# Patient Record
Sex: Male | Born: 1960
Health system: Southern US, Community
[De-identification: ages and names within clinical notes are randomized; demographics above are authoritative.]

## PROBLEM LIST (undated history)

## (undated) DIAGNOSIS — E785 Hyperlipidemia, unspecified: Secondary | ICD-10-CM

## (undated) DIAGNOSIS — T7840XA Allergy, unspecified, initial encounter: Secondary | ICD-10-CM

## (undated) DIAGNOSIS — J45909 Unspecified asthma, uncomplicated: Secondary | ICD-10-CM

## (undated) DIAGNOSIS — L409 Psoriasis, unspecified: Secondary | ICD-10-CM

## (undated) DIAGNOSIS — G40909 Epilepsy, unspecified, not intractable, without status epilepticus: Secondary | ICD-10-CM

## (undated) HISTORY — DX: Psoriasis, unspecified: L40.9

## (undated) HISTORY — DX: Unspecified asthma, uncomplicated: J45.909

## (undated) HISTORY — DX: Allergy, unspecified, initial encounter: T78.40XA

## (undated) HISTORY — PX: POLYPECTOMY: SHX149

## (undated) HISTORY — DX: Epilepsy, unspecified, not intractable, without status epilepticus: G40.909

## (undated) HISTORY — DX: Hyperlipidemia, unspecified: E78.5

---

## 2009-10-27 HISTORY — PX: COLONOSCOPY: SHX174

## 2013-11-04 ENCOUNTER — Ambulatory Visit
Admission: RE | Admit: 2013-11-04 | Discharge: 2013-11-04 | Disposition: A | Discharge status: Home / Self Care | Payer: BC Managed Care – PPO | Source: Ambulatory Visit | Attending: Chiropractic Medicine | Admitting: Chiropractic Medicine

## 2013-11-04 ENCOUNTER — Other Ambulatory Visit: Payer: Self-pay | Admitting: Chiropractic Medicine

## 2013-11-04 DIAGNOSIS — M5412 Radiculopathy, cervical region: Secondary | ICD-10-CM

## 2013-11-04 DIAGNOSIS — M542 Cervicalgia: Secondary | ICD-10-CM

## 2013-11-04 DIAGNOSIS — M5416 Radiculopathy, lumbar region: Secondary | ICD-10-CM

## 2013-11-16 ENCOUNTER — Ambulatory Visit (INDEPENDENT_AMBULATORY_CARE_PROVIDER_SITE_OTHER): Payer: BC Managed Care – PPO | Admitting: Physician Assistant

## 2013-11-16 ENCOUNTER — Encounter: Payer: Self-pay | Admitting: Physician Assistant

## 2013-11-16 VITALS — BP 126/88 | HR 78 | Temp 98.6°F | Resp 16 | Ht 68.0 in | Wt 190.0 lb

## 2013-11-16 DIAGNOSIS — E785 Hyperlipidemia, unspecified: Secondary | ICD-10-CM

## 2013-11-16 DIAGNOSIS — L409 Psoriasis, unspecified: Secondary | ICD-10-CM | POA: Insufficient documentation

## 2013-11-16 DIAGNOSIS — G40909 Epilepsy, unspecified, not intractable, without status epilepticus: Secondary | ICD-10-CM

## 2013-11-16 DIAGNOSIS — L408 Other psoriasis: Secondary | ICD-10-CM

## 2013-11-16 DIAGNOSIS — Z7189 Other specified counseling: Secondary | ICD-10-CM

## 2013-11-16 DIAGNOSIS — Z7689 Persons encountering health services in other specified circumstances: Secondary | ICD-10-CM

## 2013-11-16 MED ORDER — PHENYTOIN SODIUM EXTENDED 100 MG PO CAPS: ORAL_CAPSULE | ORAL | Status: DC

## 2013-11-16 NOTE — Progress Notes (Signed)
Patient presents to clinic today to establish care.  Chronic Issues: Psoriasis -- Patient endorses mild symptoms.  Symptoms are well-controlled with Taclonex.  Seizure Disorder -- Patient endorses hx of grand-mal seizures beginning when he was in high school.  Has been on phenytoin 500 mg QHS for ~ 5 years.  Patient denies seizure in ~ 1 year.  Has no current neurology follow-up.  Needs a refill of his medication.  Health Maintenance: Dental -- UTD Vision -- UTD Immunizations -- Tetanus overdue. Patient declines AMA. Declines flu shot. Colonoscopy -- 2010; no abnormal findings  Past Medical History  Diagnosis Date  . Seizure disorder     History reviewed. No pertinent past surgical history.  No current outpatient prescriptions on file prior to visit.   No current facility-administered medications on file prior to visit.    Allergies  Allergen Reactions  . Tricor [Fenofibrate] Rash    Family History  Problem Relation Age of Onset  . Alcohol abuse Father   . Cancer Maternal Grandfather     History   Social History  . Marital Status: Single    Spouse Name: N/A    Number of Children: N/A  . Years of Education: N/A   Occupational History  . Not on file.   Social History Main Topics  . Smoking status: Light Tobacco Smoker    Types: Cigars  . Smokeless tobacco: Never Used     Comment: occasional cigar  . Alcohol Use: Yes     Comment: occasional  . Drug Use: No  . Sexual Activity: Yes    Birth Control/ Protection: Condom   Other Topics Concern  . Not on file   Social History Narrative  . No narrative on file    Review of Systems  Constitutional: Negative for fever and weight loss.  HENT: Negative for ear discharge, ear pain, hearing loss and tinnitus.   Eyes: Negative for blurred vision, double vision, photophobia and pain.  Respiratory: Negative for cough, shortness of breath and wheezing.   Cardiovascular: Negative for chest pain and palpitations.   Gastrointestinal: Negative for heartburn, nausea, vomiting, abdominal pain, diarrhea, constipation, blood in stool and melena.  Genitourinary: Negative for dysuria, urgency, frequency, hematuria and flank pain.       Nocturia x 1  Neurological: Positive for seizures. Negative for dizziness, loss of consciousness and headaches.  Endo/Heme/Allergies: Negative for environmental allergies.  Psychiatric/Behavioral: Negative for depression, suicidal ideas, hallucinations and substance abuse. The patient is not nervous/anxious and does not have insomnia.     BP 126/88  Pulse 78  Temp(Src) 98.6 F (37 C) (Oral)  Resp 16  Ht 5\' 8"  (1.727 m)  Wt 190 lb (86.183 kg)  BMI 28.90 kg/m2  Physical Exam  Vitals reviewed. Constitutional: He is oriented to person, place, and time and well-developed, well-nourished, and in no distress.  HENT:  Head: Normocephalic and atraumatic.  Right Ear: External ear normal.  Left Ear: External ear normal.  Nose: Nose normal.  Mouth/Throat: Oropharynx is clear and moist. No oropharyngeal exudate.  TM within normal limits.  Eyes: Conjunctivae and EOM are normal. Pupils are equal, round, and reactive to light.  Neck: Neck supple.  Cardiovascular: Normal rate, regular rhythm, normal heart sounds and intact distal pulses.   Pulmonary/Chest: Effort normal and breath sounds normal. No respiratory distress. He has no wheezes. He has no rales. He exhibits no tenderness.  Abdominal: Soft. Bowel sounds are normal. He exhibits no distension and no mass. There is no tenderness.  There is no rebound and no guarding.  Lymphadenopathy:    He has no cervical adenopathy.  Neurological: He is alert and oriented to person, place, and time.  Skin: Skin is warm and dry.  Presence of scattered erythematous psoriatic papules with silvery scales.  Psychiatric: Affect normal.    No results found for this or any previous visit (from the past 2160 hour(s)).  Assessment/Plan: No  problem-specific assessment & plan notes found for this encounter.

## 2013-11-16 NOTE — Progress Notes (Signed)
Pre visit review using our clinic review tool, if applicable. No additional management support is needed unless otherwise documented below in the visit note. 

## 2013-11-16 NOTE — Patient Instructions (Signed)
Please return to our office at you earliest convenience for labs.  I will call you with your results.  Please get your records from your previous doctor sent to our office.  Please return in 3 months for follow-up.  For arm pain, please take Aleve and apply a topical Icy Hot to the area.  Also apply a heating pad.  Please call or return to clinic if symptoms do not continue to improve.

## 2013-11-20 DIAGNOSIS — Z7689 Persons encountering health services in other specified circumstances: Secondary | ICD-10-CM | POA: Insufficient documentation

## 2013-11-20 NOTE — Assessment & Plan Note (Signed)
Patient endorses history of hyperlipidemia.  Denies current medication.  Will need fasting lipid profile.

## 2013-11-20 NOTE — Assessment & Plan Note (Signed)
Medical history reviewed and updated.  Declines recommended immunizations.  Will obtain records from previous PCP.

## 2013-11-20 NOTE — Assessment & Plan Note (Signed)
Continue current medication regimen

## 2013-11-20 NOTE — Assessment & Plan Note (Signed)
Controlled on phenytoin.  Will need referral to Neurology for routine follow-up.  Will refill phenytoin.  Will obtain CBC, CMP, folate and phenytoin levels.

## 2013-11-29 ENCOUNTER — Telehealth: Payer: Self-pay | Admitting: Physician Assistant

## 2013-11-29 NOTE — Telephone Encounter (Signed)
Relevant patient education assigned to patient using Emmi. ° °

## 2013-12-16 ENCOUNTER — Telehealth: Payer: Self-pay | Admitting: *Deleted

## 2013-12-16 ENCOUNTER — Encounter: Payer: Self-pay | Admitting: Neurology

## 2013-12-16 ENCOUNTER — Other Ambulatory Visit: Payer: Self-pay | Admitting: *Deleted

## 2013-12-16 ENCOUNTER — Ambulatory Visit (INDEPENDENT_AMBULATORY_CARE_PROVIDER_SITE_OTHER): Payer: BC Managed Care – PPO | Admitting: Neurology

## 2013-12-16 VITALS — BP 140/80 | HR 68 | Resp 14 | Ht 68.0 in | Wt 189.2 lb

## 2013-12-16 DIAGNOSIS — T4275XA Adverse effect of unspecified antiepileptic and sedative-hypnotic drugs, initial encounter: Secondary | ICD-10-CM

## 2013-12-16 DIAGNOSIS — M839 Adult osteomalacia, unspecified: Secondary | ICD-10-CM

## 2013-12-16 DIAGNOSIS — G40909 Epilepsy, unspecified, not intractable, without status epilepticus: Secondary | ICD-10-CM

## 2013-12-16 DIAGNOSIS — R569 Unspecified convulsions: Secondary | ICD-10-CM

## 2013-12-16 DIAGNOSIS — M835 Other drug-induced osteomalacia in adults: Secondary | ICD-10-CM

## 2013-12-16 MED ORDER — PHENYTOIN SODIUM EXTENDED 100 MG PO CAPS: ORAL_CAPSULE | ORAL | Status: DC

## 2013-12-16 NOTE — Progress Notes (Signed)
NEUROLOGY CONSULTATION NOTE  Jacob Morales MRN: [id number] DOB: June 11, 1961  Referring provider: Leeanne Rio Primary care provider: Leeanne Rio  Reason for consult:  Establish care for seizures  Thank you for your kind referral of [name] for consultation of the above symptoms. Although his history is well known to you, please allow me to reiterate it for the purpose of our medical record.   HISTORY OF PRESENT ILLNESS: This is a very pleasant 53 year old right-handed man with a history of seizures since age 70. He reports hitting the back of his head at age 41, there was no loss of consciousness at that time, then a few months later he had his first seizure described as twitching of the arms. This did not progress to a generalized tonic-clonic seizure. A few months later, he had 2 more episodes of body twitching, one time he fell due to leg twitching. He was diagnosed with seizures at that time and started on Dilantin. His first generalized tonic-clonic seizure occurred at age 69, in the setting of stopping his medication. His mother had described a blank stare and then a generalized convulsion. He has had 3 GTCs in his lifetime, provoked by sleep deprivation and missing medications. The last GTC was at age 86. He reports that if he would miss Dilantin for a week, he would feel the twitching coming on. The last time this happened was several years ago. He denies any staring or unresponsive episodes, gaps in time, olfactory or gustatory hallucinations, focal numbness, tingling, or weakness. He has been taking Dilantin 500 mg at bedtime for many years. He does not recall his last Dilantin level. Last EEG and MRI were more than 10 years ago in [place], he vaguely recalls her the EEG as being abnormal.  He denies any headaches, dizziness, diplopia, dysarthria, dysphasia, neck or back pain, bowel or bladder dysfunction. He denies any falls, fractures, gum problems. He  currently works as an Insurance underwriter.  Epilepsy Risk Factors:  He had a normal birth and early development.  There is no history of febrile convulsions, CNS infections such as meningitis/encephalitis, significant traumatic brain injury, neurosurgical procedures, or family history of seizures.  Prior AEDs: None except Dilantin   PAST MEDICAL HISTORY: Past Medical History  Diagnosis Date  . Seizure disorder   . Psoriasis     PAST SURGICAL HISTORY: No past surgical history on file.  MEDICATIONS: Current Outpatient Prescriptions on File Prior to Visit  Medication Sig Dispense Refill  . calcipotriene-betamethasone (TACLONEX) ointment Apply 1 application topically daily.       No current facility-administered medications on file prior to visit.    ALLERGIES: Allergies  Allergen Reactions  . Tricor [Fenofibrate] Rash    FAMILY HISTORY: Family History  Problem Relation Age of Onset  . Alcohol abuse Father   . Cancer Maternal Grandfather     SOCIAL HISTORY: History   Social History  . Marital Status: Single    Spouse Name: N/A    Number of Children: N/A  . Years of Education: N/A   Occupational History  . Not on file.   Social History Main Topics  . Smoking status: Never Smoker   . Smokeless tobacco: Never Used  . Alcohol Use: Yes     Comment: occasional  . Drug Use: No  . Sexual Activity: Yes    Birth Control/ Protection: Condom   Other Topics Concern  . Not on file   Social History Narrative  .  No narrative on file    REVIEW OF SYSTEMS: Constitutional: No fevers, chills, or sweats, no generalized fatigue, change in appetite Eyes: No visual changes, double vision, eye pain Ear, nose and throat: No hearing loss, ear pain, nasal congestion, sore throat Cardiovascular: No chest pain, palpitations Respiratory:  No shortness of breath at rest or with exertion, wheezes GastrointestinaI: No nausea, vomiting, diarrhea, abdominal pain, fecal  incontinence Genitourinary:  No dysuria, urinary retention or frequency Musculoskeletal:  No neck pain, back pain Integumentary: No rash, pruritus, skin lesions Neurological: as above Psychiatric: No depression, insomnia, anxiety Endocrine: No palpitations, fatigue, diaphoresis, mood swings, change in appetite, change in weight, increased thirst Hematologic/Lymphatic:  No anemia, purpura, petechiae. Allergic/Immunologic: no itchy/runny eyes, nasal congestion, recent allergic reactions, rashes  PHYSICAL EXAM: Filed Vitals:   12/16/13 1519  BP: 140/80  Pulse: 68  Resp: 14   General: No acute distress Head:  Normocephalic/atraumatic Neck: supple, no paraspinal tenderness, full range of motion Back: No paraspinal tenderness Heart: regular rate and rhythm Lungs: Clear to auscultation bilaterally. Vascular: No carotid bruits. Skin/Extremities: No rash, no edema Neurological Exam: Mental status: alert and oriented to person, place, and time, no dysarthria or dysphagia. Fund of knowledge is appropriate.  Recent and remote memory intact.  Attention span and concentration normal.  Repeats and names without difficulty. Cranial nerves: CN I: not tested CN II: pupils equal, round and reactive to light, visual fields intact, fundi unremarkable. CN III, IV, VI:  full range of motion, no nystagmus, no ptosis CN V: facial sensation intact CN VII: upper and lower face symmetric CN VIII: hearing intact CN IX, X: gag intact, uvula midline CN XI: sternocleidomastoid and trapezius muscles intact CN XII: tongue midline Bulk & Tone: normal, no fasciculations. Motor: 5/5 throughout with no pronator drift. Sensation: intact to light touch, cold, pin, vibration and joint position sense.  No extinction to double simultaneous stimulation.  Romberg test negative Deep Tendon Reflexes: +2 throughout, no clonus Plantar responses: downgoing bilaterally Finger to nose testing: No incoordination Gait:  narrow-based and steady, able to tandem walk adequately.  IMPRESSION: This is a very pleasant 53 year old right-handed man with a history of seizures since age 60. By description, seizures are suggestive of a primary generalized epilepsy with myoclonic jerks and generalized tonic-clonic seizures. He denies any history of absence seizures.  He has been well controlled on Dilantin 500 mg at bedtime for several years. Last GTC was almost 20 years ago. Dilantin is indicated for partial epilepsy, and he may benefit from more broad-spectrum antiepileptic medications if he were having side effects of the Dilantin, however since seizures have been well controlled on the medication and he is not having any side effects, I am very hesitant to switch him at this time. Baseline Dilantin level will be checked. We discussed the long-term side effects of chronic Dilantin therapy, including bone loss and neuropathy. Exam today is normal. A bone density scan and vitamin D 25-hydroxy level will be checked. Avoidance of seizure triggers, including missing medications, alcohol, and sleep deprivation were discussed.  [place] driving laws were discussed with the patient, and he knows to stop driving after a seizure, until 6 months seizure-free.   Return to clinic in 6 months or earlier if needed.  Thank you for allowing me to participate in the care of this patient. Please do not hesitate to call for any questions or concerns.   Ellouise Newer, M.D.

## 2013-12-16 NOTE — Patient Instructions (Signed)
1. Continue Dilantin 500mg  at bedtime 2. Check Dilantin level, vitamin D level 3. Check bone density scan  Seizure precautions  1. If medication has been prescribed for you to prevent seizures, take it exactly as directed.  Do not stop taking the medicine without talking to your doctor first, even if you have not had a seizure in a long time.   2. Avoid activities in which a seizure would cause danger to yourself or to others.  Don't operate dangerous machinery, swim alone, or climb in high or dangerous places, such as on ladders, roofs, or girders.  Do not drive unless your doctor says you may.  3. If you have any warning that you may have a seizure, lay down in a safe place where you can't hurt yourself.    4.  No driving for 6 months from last seizure, as per [facility].   Please refer to the following link on the [facility] website for more information: http://[email or web address]/[email or web address]/[email or web address]   5.  Maintain good sleep hygiene.  6.  Notify your neurology if you are planning pregnancy or if you become pregnant.  7.  Contact your doctor if you have any problems that may be related to the medicine you are taking.  8.  Call 911 and bring the patient back to the ED if:        A.  The seizure lasts longer than 5 minutes.       B.  The patient doesn't awaken shortly after the seizure  C.  The patient has new problems such as difficulty seeing, speaking or moving  D.  The patient was injured during the seizure  E.  The patient has a temperature over 102 F (39C)  F.  The patient vomited and now is having trouble breathing

## 2013-12-16 NOTE — Telephone Encounter (Signed)
Patient has appt at [facility] 01/03/14 at 2:45  For dexa scan

## 2013-12-20 LAB — VITAMIN D 25 HYDROXY (VIT D DEFICIENCY, FRACTURES): VIT D 25 HYDROXY: 17 ng/mL — AB (ref 30–89)

## 2013-12-20 LAB — PHENYTOIN LEVEL, TOTAL: PHENYTOIN LVL: 9.6 ug/mL — AB (ref 10.0–20.0)

## 2013-12-26 ENCOUNTER — Other Ambulatory Visit: Payer: Self-pay | Admitting: *Deleted

## 2013-12-26 ENCOUNTER — Telehealth: Payer: Self-pay | Admitting: Neurology

## 2013-12-26 MED ORDER — VITAMIN D (ERGOCALCIFEROL) 1.25 MG (50000 UNIT) PO CAPS: 50000.0000 [IU] | ORAL_CAPSULE | ORAL | Status: DC

## 2013-12-26 NOTE — Telephone Encounter (Signed)
Vit d was ordered and sent to the pharmacy message was left on machine to take one tablet by mouth for 8 weeks then patient can take an over the counted vit d after that

## 2013-12-26 NOTE — Telephone Encounter (Signed)
Rt returning your call

## 2013-12-26 NOTE — Telephone Encounter (Signed)
Message copied by Ella Jubilee on Mon Dec 26, 2013  2:02 PM ------      Message from: Cameron Sprang      Created: Mon Dec 26, 2013  9:41 AM      Regarding: vitamin D replacement       Hi Danae Chen,            For Mr. Hackbart, his vitamin D level was low, did we send a prescription for replacement?  I can't recall signing one.  Thanks ------

## 2013-12-27 NOTE — Telephone Encounter (Signed)
Unable to reach patient left message on machine that Rx for Vit d was sent to the pharmacy on file advised to to call back if he had any questions

## 2014-01-03 ENCOUNTER — Other Ambulatory Visit: Payer: BC Managed Care – PPO

## 2014-04-20 ENCOUNTER — Telehealth: Payer: Self-pay | Admitting: Neurology

## 2014-04-20 NOTE — Telephone Encounter (Signed)
Please call regarding med refills. CB [phone] / Venida Jarvis

## 2014-04-21 ENCOUNTER — Other Ambulatory Visit: Payer: Self-pay | Admitting: *Deleted

## 2014-04-21 DIAGNOSIS — G40909 Epilepsy, unspecified, not intractable, without status epilepticus: Secondary | ICD-10-CM

## 2014-04-21 MED ORDER — PHENYTOIN SODIUM EXTENDED 100 MG PO CAPS: ORAL_CAPSULE | ORAL | Status: DC

## 2014-04-21 NOTE — Telephone Encounter (Signed)
Rx refilled.

## 2014-04-21 NOTE — Telephone Encounter (Signed)
Patient requesting refill. 

## 2014-06-16 ENCOUNTER — Ambulatory Visit: Payer: BC Managed Care – PPO | Admitting: Neurology

## 2014-06-29 ENCOUNTER — Encounter: Payer: Self-pay | Admitting: Neurology

## 2014-06-29 ENCOUNTER — Ambulatory Visit (INDEPENDENT_AMBULATORY_CARE_PROVIDER_SITE_OTHER): Payer: Managed Care, Other (non HMO) | Admitting: Neurology

## 2014-06-29 VITALS — BP 110/78 | HR 62 | Resp 16 | Ht 68.0 in | Wt 189.0 lb

## 2014-06-29 DIAGNOSIS — E559 Vitamin D deficiency, unspecified: Secondary | ICD-10-CM

## 2014-06-29 DIAGNOSIS — R569 Unspecified convulsions: Secondary | ICD-10-CM

## 2014-06-29 NOTE — Progress Notes (Signed)
NEUROLOGY FOLLOW UP OFFICE NOTE  Jacob Morales [id number]  HISTORY OF PRESENT ILLNESS: I had the pleasure of seeing Jacob Morales in follow-up in the neurology clinic on 06/29/2014.  The patient was last seen 7 months ago for seizures.  He is well-controlled on Dilantin 500mg  qhs with no seizures since age 53.  Bloodwork from February 2015 showed a Dilantin level of 9.6 and vitamin D level of 17. He took 50000 units of ergocalciferol for several weeks. He denies any seizures or seizure-like symptoms, no myoclonic jerks. Denies any headaches, dizziness, diplopia, focal numbness/tingling/weakness.  He reports fatigue today due to change in shift, he works from 2:30pm to midnight, sometimes up to 2am, but still gest 7-8 hours of sleep after. He reports compliance to medications. He is driving.  Seizure History: This is a very pleasant 53 yo RH man with a history of seizures since age 31. He reports hitting the back of his head at age 30, there was no loss of consciousness at that time, then a few months later he had his first seizure described as twitching of the arms. This did not progress to a generalized tonic-clonic seizure. A few months later, he had 2 more episodes of body twitching, one time he fell due to leg twitching. He was diagnosed with seizures at that time and started on Dilantin. His first generalized tonic-clonic seizure occurred at age 53, in the setting of stopping his medication. His mother had described a blank stare and then a generalized convulsion. He has had 3 GTCs in his lifetime, provoked by sleep deprivation and missing medications. The last GTC was at age 53. He reports that if he would miss Dilantin for a week, he would feel the twitching coming on. The last time this happened was several years ago. He denies any staring or unresponsive episodes, gaps in time, olfactory or gustatory hallucinations, focal numbness, tingling, or weakness. He has been taking Dilantin 500  mg at bedtime for many years.  He denies any headaches, dizziness, diplopia, dysarthria, dysphasia, neck or back pain, bowel or bladder dysfunction. He denies any falls, fractures, gum problems. He currently works as an Insurance underwriter.   Epilepsy Risk Factors: He had a normal birth and early development. There is no history of febrile convulsions, CNS infections such as meningitis/encephalitis, significant traumatic brain injury, neurosurgical procedures, or family history of seizures.   Prior AEDs: None except Dilantin  Diagnostic Data: Last EEG and MRI were more than 10 years ago in [place], he vaguely recalls her the EEG as being abnormal.   PAST MEDICAL HISTORY: Past Medical History  Diagnosis Date  . Seizure disorder   . Psoriasis     MEDICATIONS: Current Outpatient Prescriptions on File Prior to Visit  Medication Sig Dispense Refill  . calcipotriene-betamethasone (TACLONEX) ointment Apply 1 application topically daily.      . phenytoin (DILANTIN) 100 MG ER capsule Take 5 capsules at night.  150 capsule  3  . Vitamin D, Ergocalciferol, (DRISDOL) 50000 UNITS CAPS capsule Take 1 capsule (50,000 Units total) by mouth every 7 (seven) days.  8 capsule  0   No current facility-administered medications on file prior to visit.    ALLERGIES: Allergies  Allergen Reactions  . Tricor [Fenofibrate] Rash    FAMILY HISTORY: Family History  Problem Relation Age of Onset  . Alcohol abuse Father   . Cancer Maternal Grandfather     SOCIAL HISTORY: History   Social History  . Marital  Status: Single    Spouse Name: N/A    Number of Children: N/A  . Years of Education: N/A   Occupational History  . Not on file.   Social History Main Topics  . Smoking status: Never Smoker   . Smokeless tobacco: Never Used  . Alcohol Use: Yes     Comment: occasional  . Drug Use: No  . Sexual Activity: Yes    Birth Control/ Protection: Condom   Other Topics Concern  . Not on file    Social History Narrative  . No narrative on file    REVIEW OF SYSTEMS: Constitutional: No fevers, chills, or sweats, + generalized fatigue, no change in appetite Eyes: No visual changes, double vision, eye pain Ear, nose and throat: No hearing loss, ear pain, nasal congestion, sore throat Cardiovascular: No chest pain, palpitations Respiratory:  No shortness of breath at rest or with exertion, wheezes GastrointestinaI: No nausea, vomiting, diarrhea, abdominal pain, fecal incontinence Genitourinary:  No dysuria, urinary retention or frequency Musculoskeletal:  No neck pain, back pain Integumentary: No rash, pruritus, skin lesions Neurological: as above Psychiatric: No depression, insomnia, anxiety Endocrine: No palpitations, fatigue, diaphoresis, mood swings, change in appetite, change in weight, increased thirst Hematologic/Lymphatic:  No anemia, purpura, petechiae. Allergic/Immunologic: no itchy/runny eyes, nasal congestion, recent allergic reactions, rashes  PHYSICAL EXAM: Filed Vitals:   06/29/14 0833  BP: 110/78  Pulse: 62  Resp: 16   General: No acute distress Head:  Normocephalic/atraumatic Neck: supple, no paraspinal tenderness, full range of motion Heart:  Regular rate and rhythm Lungs:  Clear to auscultation bilaterally Back: No paraspinal tenderness Skin/Extremities: No rash, no edema Neurological Exam: alert and oriented to person, place, and time. No aphasia or dysarthria. Fund of knowledge is appropriate.  Recent and remote memory are intact.  Attention and concentration are normal.    Able to name objects and repeat phrases. Cranial nerves: Pupils equal, round, reactive to light.  Fundoscopic exam unremarkable, no papilledema. Extraocular movements intact with no nystagmus. Visual fields full. Facial sensation intact. No facial asymmetry. Tongue, uvula, palate midline.  Motor: Bulk and tone normal, muscle strength 5/5 throughout with no pronator drift.  Sensation  to light touch, temperature and vibration intact.  No extinction to double simultaneous stimulation.  Deep tendon reflexes 2+ throughout, toes downgoing.  Finger to nose testing intact.  Gait narrow-based and steady, able to tandem walk adequately.  Romberg negative.  IMPRESSION: This is a very pleasant 52 yo RH man with a history of seizures since age 41. By description, seizures are suggestive of a primary generalized epilepsy with myoclonic jerks and generalized tonic-clonic seizures. He denies any history of absence seizures. He has been well controlled on Dilantin 500 mg at bedtime for several years. Last GTC was almost 20 years ago. Dilantin is indicated for partial epilepsy, and he may benefit from more broad-spectrum antiepileptic medications if he were having side effects of the Dilantin, however since seizures have been well controlled on the medication and he is not having any side effects, I am very hesitant to switch him at this time. Dilantin level is 9.6, however we discussed that if he has been seizure-free on this level for more than 20 years, no changes would be made.  He has been unable to do bone density scan, his vitamin D level was low 6 months ago, s/p replacement. Recheck vitamin D, start daily calcium with vitamin D supplementation.  Refills for Dilantin sent today. We again discussed avoidance of seizure  triggers, including missing medications, alcohol, and sleep deprivation were discussed. [place] driving laws were discussed with the patient, and he knows to stop driving after a seizure, until 6 months seizure-free. He will follow-up annually and knows to call our office for any problems in the interim.  Thank you for allowing me to participate in his care.  Please do not hesitate to call for any questions or concerns.  The duration of this appointment visit was 15 minutes of face-to-face time with the patient.  Greater than 50% of this time was spent in counseling, explanation of  diagnosis, planning of further management, and coordination of care.   Ellouise Newer, M.D.

## 2014-06-29 NOTE — Patient Instructions (Addendum)
1. Continue Dilantin 500mg  at bedtime 2. Bloodwork for vitamin D level 3. Start daily calcium 600mg  + vitamin D 400 IU twice a day 4. Schedule DEXA scan 5. Follow-up in 1 year, call for any problems  Seizure Precautions: 1. If medication has been prescribed for you to prevent seizures, take it exactly as directed.  Do not stop taking the medicine without talking to your doctor first, even if you have not had a seizure in a long time.   2. Avoid activities in which a seizure would cause danger to yourself or to others.  Don't operate dangerous machinery, swim alone, or climb in high or dangerous places, such as on ladders, roofs, or girders.  Do not drive unless your doctor says you may.  3. If you have any warning that you may have a seizure, lay down in a safe place where you can't hurt yourself.    4.  No driving for 6 months from last seizure, as per [facility].   Please refer to the following link on the [place] website for more information: http://[email or web address]/[email or web address]/drivingu.cfm   5.  Maintain good sleep hygiene.  6.  Contact your doctor if you have any problems that may be related to the medicine you are taking.  7.  Call 911 and bring the patient back to the ED if:        A.  The seizure lasts longer than 5 minutes.       B.  The patient doesn't awaken shortly after the seizure  C.  The patient has new problems such as difficulty seeing, speaking or moving  D.  The patient was injured during the seizure  E.  The patient has a temperature over 102 F (39C)  F.  The patient vomited and now is having trouble breathing

## 2014-06-30 LAB — VITAMIN D 25 HYDROXY (VIT D DEFICIENCY, FRACTURES): Vit D, 25-Hydroxy: 19 ng/mL — ABNORMAL LOW (ref 30–89)

## 2014-07-04 ENCOUNTER — Ambulatory Visit
Admission: RE | Admit: 2014-07-04 | Discharge: 2014-07-04 | Disposition: A | Discharge status: Home / Self Care | Payer: Managed Care, Other (non HMO) | Source: Ambulatory Visit | Attending: Neurology | Admitting: Neurology

## 2014-07-10 ENCOUNTER — Encounter: Payer: Self-pay | Admitting: Family Medicine

## 2014-07-10 ENCOUNTER — Other Ambulatory Visit: Payer: Self-pay | Admitting: Family Medicine

## 2014-07-10 MED ORDER — VITAMIN D (ERGOCALCIFEROL) 1.25 MG (50000 UNIT) PO CAPS: 50000.0000 [IU] | ORAL_CAPSULE | ORAL | Status: DC

## 2014-07-13 ENCOUNTER — Telehealth: Payer: Self-pay | Admitting: Family Medicine

## 2014-07-13 NOTE — Telephone Encounter (Signed)
Message copied by Thurmon Fair on Thu Jul 13, 2014  2:56 PM ------      Message from: Cameron Sprang      Created: Thu Jul 13, 2014 12:29 PM      Regarding: DEXA results       Pls let him know bone scan normal, continue with vitamin D replacement. Thanks ------

## 2014-07-13 NOTE — Telephone Encounter (Signed)
All of patient's recent results were mailed with a letter to his home due to not being able to reach him by phone.

## 2014-08-24 ENCOUNTER — Other Ambulatory Visit: Payer: Self-pay | Admitting: Neurology

## 2014-09-19 ENCOUNTER — Ambulatory Visit (INDEPENDENT_AMBULATORY_CARE_PROVIDER_SITE_OTHER): Payer: Managed Care, Other (non HMO) | Admitting: Physician Assistant

## 2014-09-19 ENCOUNTER — Encounter: Payer: Self-pay | Admitting: Physician Assistant

## 2014-09-19 VITALS — BP 111/77 | HR 108 | Temp 97.9°F | Resp 17 | Ht 69.0 in | Wt 190.0 lb

## 2014-09-19 DIAGNOSIS — Z125 Encounter for screening for malignant neoplasm of prostate: Secondary | ICD-10-CM | POA: Insufficient documentation

## 2014-09-19 DIAGNOSIS — Z136 Encounter for screening for cardiovascular disorders: Secondary | ICD-10-CM

## 2014-09-19 DIAGNOSIS — L409 Psoriasis, unspecified: Secondary | ICD-10-CM

## 2014-09-19 DIAGNOSIS — R5382 Chronic fatigue, unspecified: Secondary | ICD-10-CM | POA: Insufficient documentation

## 2014-09-19 DIAGNOSIS — Z23 Encounter for immunization: Secondary | ICD-10-CM

## 2014-09-19 DIAGNOSIS — E785 Hyperlipidemia, unspecified: Secondary | ICD-10-CM

## 2014-09-19 DIAGNOSIS — E559 Vitamin D deficiency, unspecified: Secondary | ICD-10-CM

## 2014-09-19 DIAGNOSIS — Z0000 Encounter for general adult medical examination without abnormal findings: Secondary | ICD-10-CM

## 2014-09-19 DIAGNOSIS — G40909 Epilepsy, unspecified, not intractable, without status epilepticus: Secondary | ICD-10-CM

## 2014-09-19 LAB — CBC
HEMATOCRIT: 44.6 % (ref 39.0–52.0)
HEMOGLOBIN: 14.9 g/dL (ref 13.0–17.0)
MCHC: 33.3 g/dL (ref 30.0–36.0)
MCV: 89 fl (ref 78.0–100.0)
Platelets: 160 10*3/uL (ref 150.0–400.0)
RBC: 5.01 Mil/uL (ref 4.22–5.81)
RDW: 13.8 % (ref 11.5–15.5)
WBC: 3.5 10*3/uL — ABNORMAL LOW (ref 4.0–10.5)

## 2014-09-19 LAB — BASIC METABOLIC PANEL WITH GFR
BUN: 18 mg/dL (ref 6–23)
CO2: 26 mEq/L (ref 19–32)
CREATININE: 0.87 mg/dL (ref 0.50–1.35)
Calcium: 9 mg/dL (ref 8.4–10.5)
Chloride: 106 mEq/L (ref 96–112)
GLUCOSE: 92 mg/dL (ref 70–99)
POTASSIUM: 4.4 meq/L (ref 3.5–5.3)
Sodium: 141 mEq/L (ref 135–145)

## 2014-09-19 LAB — URINALYSIS, ROUTINE W REFLEX MICROSCOPIC
BILIRUBIN URINE: NEGATIVE
HGB URINE DIPSTICK: NEGATIVE
Ketones, ur: NEGATIVE
Leukocytes, UA: NEGATIVE
Nitrite: NEGATIVE
RBC / HPF: NONE SEEN (ref 0–?)
Specific Gravity, Urine: 1.025 (ref 1.000–1.030)
Total Protein, Urine: NEGATIVE
UROBILINOGEN UA: 0.2 (ref 0.0–1.0)
Urine Glucose: NEGATIVE
WBC, UA: NONE SEEN (ref 0–?)
pH: 5.5 (ref 5.0–8.0)

## 2014-09-19 LAB — HEPATIC FUNCTION PANEL
ALT: 24 U/L (ref 0–53)
AST: 21 U/L (ref 0–37)
Albumin: 4.3 g/dL (ref 3.5–5.2)
Alkaline Phosphatase: 73 U/L (ref 39–117)
BILIRUBIN DIRECT: 0 mg/dL (ref 0.0–0.3)
BILIRUBIN TOTAL: 0.5 mg/dL (ref 0.2–1.2)
Total Protein: 6.8 g/dL (ref 6.0–8.3)

## 2014-09-19 LAB — LIPID PANEL W/DIRECT LDL/HDL RATIO
Cholesterol: 217 mg/dL — ABNORMAL HIGH (ref 0–200)
HDL: 36 mg/dL — ABNORMAL LOW (ref >39)
LDL DIRECT: 135 mg/dL — AB
LDL/HDL RATIO (DIRECT LDL): 3.8 ratio
TRIGLYCERIDES: 236 mg/dL — AB (ref <150)
Total Chol/HDL Ratio: 6 Ratio

## 2014-09-19 LAB — PSA: PSA: 0.78 ng/mL (ref 0.10–4.00)

## 2014-09-19 LAB — TSH: TSH: 1.46 u[IU]/mL (ref 0.35–4.50)

## 2014-09-19 LAB — HEMOGLOBIN A1C: Hgb A1c MFr Bld: 6.1 % (ref 4.6–6.5)

## 2014-09-19 MED ORDER — CALCIPOTRIENE-BETAMETH DIPROP 0.005-0.064 % EX OINT: 1.0000 "application " | TOPICAL_OINTMENT | Freq: Every day | CUTANEOUS | Status: DC

## 2014-09-19 MED ORDER — CALCIPOTRIENE-BETAMETH DIPROP 0.005-0.064 % EX SUSP: Freq: Every day | CUTANEOUS | Status: DC

## 2014-09-19 NOTE — Assessment & Plan Note (Signed)
I have reviewed the patient's medical history in detail and updated the computerized patient record. Patient up-to-date on health maintenance.  PHQ-9 Depression screen negative. No hx of fall.  Will obtain fasting labs at today's visit.

## 2014-09-19 NOTE — Assessment & Plan Note (Signed)
Medications refilled.  Referral placed to [facility].

## 2014-09-19 NOTE — Progress Notes (Signed)
Pre visit review using our clinic review tool, if applicable. No additional management support is needed unless otherwise documented below in the visit note. 

## 2014-09-19 NOTE — Assessment & Plan Note (Signed)
Asymptomatic.  Will obtain PSA today after discussion of risks vs benefits of testing.

## 2014-09-19 NOTE — Progress Notes (Signed)
Patient presents to clinic today for annual exam.  Patient is fasting for labs.  Acute Concerns: Patient complains of fatigue since beginning second shirt.  Endorses sleeping well at night, but still feels excessively tired. Is currently being treated for low vitamin D.  Endorses history of low testosterone several years ago but was never treated for this.   Chronic Issues: Seizure Disorder -- Followed by Neurology Delice Lesch). Doing well without recurrence of seizure.  Is currently on Dilantin.  Psoriasis -- Requesting referral to Dermatology.  Is doing well with Taclonex therapy.  Hyperlipidemia -- Controlled with TLCs.  Will be getting repeat labs today.  Health Maintenance: Dental -- up-to-date Vision -- overdue. Immunizations -- flu shot given today. Colonoscopy -- 2010; endorses diverticulosis but otherwise normal. Due in 2020.  Past Medical History  Diagnosis Date  . Seizure disorder   . Psoriasis     History reviewed. No pertinent past surgical history.  Current Outpatient Prescriptions on File Prior to Visit  Medication Sig Dispense Refill  . phenytoin (DILANTIN) 100 MG ER capsule TAKE FIVE CAPSULES BY MOUTH ONCE DAILY AT BEDTIME 150 capsule 3  . Vitamin D, Ergocalciferol, (DRISDOL) 50000 UNITS CAPS capsule Take 1 capsule (50,000 Units total) by mouth every 7 (seven) days. 8 capsule 0   No current facility-administered medications on file prior to visit.    Allergies  Allergen Reactions  . Tricor [Fenofibrate] Rash    Family History  Problem Relation Age of Onset  . Alcohol abuse Father   . Cancer Maternal Grandfather     History   Social History  . Marital Status: Single    Spouse Name: N/A    Number of Children: N/A  . Years of Education: N/A   Occupational History  . Not on file.   Social History Main Topics  . Smoking status: Never Smoker   . Smokeless tobacco: Never Used  . Alcohol Use: Yes     Comment: occasional  . Drug Use: No  .  Sexual Activity: Yes    Birth Control/ Protection: Condom   Other Topics Concern  . Not on file   Social History Narrative   Review of Systems  Constitutional: Negative for fever and weight loss.  HENT: Negative for ear discharge, ear pain, hearing loss and tinnitus.   Eyes: Negative for blurred vision, double vision, photophobia and pain.  Respiratory: Negative for cough and shortness of breath.   Cardiovascular: Negative for chest pain and palpitations.  Gastrointestinal: Negative for heartburn, nausea, vomiting, abdominal pain, diarrhea, constipation, blood in stool and melena.  Genitourinary: Negative for dysuria, urgency, frequency, hematuria and flank pain.  Neurological: Negative for dizziness, loss of consciousness and headaches.  Endo/Heme/Allergies: Negative for environmental allergies.  Psychiatric/Behavioral: Negative for depression. The patient is not nervous/anxious and does not have insomnia.    BP 111/77 mmHg  Pulse 108  Temp(Src) 97.9 F (36.6 C) (Oral)  Resp 17  Ht 5\' 9"  (1.753 m)  Wt 190 lb (86.183 kg)  BMI 28.05 kg/m2  SpO2 100%  Physical Exam  Constitutional: He is oriented to person, place, and time and well-developed, well-nourished, and in no distress.  HENT:  Head: Normocephalic and atraumatic.  Right Ear: External ear normal.  Left Ear: External ear normal.  Nose: Nose normal.  Mouth/Throat: Oropharynx is clear and moist. No oropharyngeal exudate.  Eyes: Conjunctivae are normal. Pupils are equal, round, and reactive to light.  Neck: Neck supple. No thyromegaly present.  Cardiovascular: Normal rate, regular  rhythm, normal heart sounds and intact distal pulses.   Pulmonary/Chest: Effort normal and breath sounds normal. No respiratory distress. He has no wheezes. He has no rales. He exhibits no tenderness.  Abdominal: Soft. Bowel sounds are normal. He exhibits no distension and no mass. There is no tenderness. There is no rebound and no guarding.    Lymphadenopathy:    He has no cervical adenopathy.  Neurological: He is alert and oriented to person, place, and time.  Skin: Skin is warm and dry. No rash noted.  Psychiatric: Affect normal.  Vitals reviewed.  Recent Results (from the past 2160 hour(s))  Vitamin D (25 hydroxy)     Status: Abnormal   Collection Time: 06/29/14  9:12 AM  Result Value Ref Range   Vit D, 25-Hydroxy 19 (L) 30 - 89 ng/mL    Comment: This assay accurately quantifies Vitamin D, which is the sum of the 25-Hydroxy forms of Vitamin D2 and D3.  Studies have shown that the optimum concentration of 25-Hydroxy Vitamin D is 30 ng/mL or higher.  Concentrations of Vitamin D between 20 and 29 ng/mL are considered to be insufficient and concentrations less than 20 ng/mL are considered to be deficient for Vitamin D.    Assessment/Plan: Seizure disorder Doing very well with current regimen.  Follow-up with Dr. Delice Lesch as scheduled.  Psoriasis Medications refilled.  Referral placed to [facility].  Hyperlipidemia Will check fasting lipid panel at today's visit.  Continue diet and exercise.  Visit for preventive health examination I have reviewed the patient's medical history in detail and updated the computerized patient record. Patient up-to-date on health maintenance.  PHQ-9 Depression screen negative. No hx of fall.  Will obtain fasting labs at today's visit.  Prostate cancer screening Asymptomatic.  Will obtain PSA today after discussion of risks vs benefits of testing.  Chronic fatigue Possibly related to low vitamin D.  Will obtain lab workup today to include CBC, TSH, Vitamin D and Testosterone levels.  Will treat accordingly.

## 2014-09-19 NOTE — Patient Instructions (Signed)
Please obtain labs. I will call you with your results. Continue medications as directed.  You will be contacted by a Dermatologist.  Follow-up will be based on results.  Preventive Care for Adults A healthy lifestyle and preventive care can promote health and wellness. Preventive health guidelines for men include the following key practices:  A routine yearly physical is a good way to check with your health care provider about your health and preventative screening. It is a chance to share any concerns and updates on your health and to receive a thorough exam.  Visit your dentist for a routine exam and preventative care every 6 months. Brush your teeth twice a day and floss once a day. Good oral hygiene prevents tooth decay and gum disease.  The frequency of eye exams is based on your age, health, family medical history, use of contact lenses, and other factors. Follow your health care provider's recommendations for frequency of eye exams.  Eat a healthy diet. Foods such as vegetables, fruits, whole grains, low-fat dairy products, and lean protein foods contain the nutrients you need without too many calories. Decrease your intake of foods high in solid fats, added sugars, and salt. Eat the right amount of calories for you.Get information about a proper diet from your health care provider, if necessary.  Regular physical exercise is one of the most important things you can do for your health. Most adults should get at least 150 minutes of moderate-intensity exercise (any activity that increases your heart rate and causes you to sweat) each week. In addition, most adults need muscle-strengthening exercises on 2 or more days a week.  Maintain a healthy weight. The body mass index (BMI) is a screening tool to identify possible weight problems. It provides an estimate of body fat based on height and weight. Your health care provider can find your BMI and can help you achieve or maintain a healthy  weight.For adults 20 years and older:  A BMI below 18.5 is considered underweight.  A BMI of 18.5 to 24.9 is normal.  A BMI of 25 to 29.9 is considered overweight.  A BMI of 30 and above is considered obese.  Maintain normal blood lipids and cholesterol levels by exercising and minimizing your intake of saturated fat. Eat a balanced diet with plenty of fruit and vegetables. Blood tests for lipids and cholesterol should begin at age 32 and be repeated every 5 years. If your lipid or cholesterol levels are high, you are over 50, or you are at high risk for heart disease, you may need your cholesterol levels checked more frequently.Ongoing high lipid and cholesterol levels should be treated with medicines if diet and exercise are not working.  If you smoke, find out from your health care provider how to quit. If you do not use tobacco, do not start.  Lung cancer screening is recommended for adults aged 35-80 years who are at high risk for developing lung cancer because of a history of smoking. A yearly low-dose CT scan of the lungs is recommended for people who have at least a 30-pack-year history of smoking and are a current smoker or have quit within the past 15 years. A pack year of smoking is smoking an average of 1 pack of cigarettes a day for 1 year (for example: 1 pack a day for 30 years or 2 packs a day for 15 years). Yearly screening should continue until the smoker has stopped smoking for at least 15 years. Yearly screening should  be stopped for people who develop a health problem that would prevent them from having lung cancer treatment.  If you choose to drink alcohol, do not have more than 2 drinks per day. One drink is considered to be 12 ounces (355 mL) of beer, 5 ounces (148 mL) of wine, or 1.5 ounces (44 mL) of liquor.  Avoid use of street drugs. Do not share needles with anyone. Ask for help if you need support or instructions about stopping the use of drugs.  High blood pressure  causes heart disease and increases the risk of stroke. Your blood pressure should be checked at least every 1-2 years. Ongoing high blood pressure should be treated with medicines, if weight loss and exercise are not effective.  If you are 14-24 years old, ask your health care provider if you should take aspirin to prevent heart disease.  Diabetes screening involves taking a blood sample to check your fasting blood sugar level. This should be done once every 3 years, after age 66, if you are within normal weight and without risk factors for diabetes. Testing should be considered at a younger age or be carried out more frequently if you are overweight and have at least 1 risk factor for diabetes.  Colorectal cancer can be detected and often prevented. Most routine colorectal cancer screening begins at the age of 82 and continues through age 34. However, your health care provider may recommend screening at an earlier age if you have risk factors for colon cancer. On a yearly basis, your health care provider may provide home test kits to check for hidden blood in the stool. Use of a small camera at the end of a tube to directly examine the colon (sigmoidoscopy or colonoscopy) can detect the earliest forms of colorectal cancer. Talk to your health care provider about this at age 22, when routine screening begins. Direct exam of the colon should be repeated every 5-10 years through age 93, unless early forms of precancerous polyps or small growths are found.  People who are at an increased risk for hepatitis B should be screened for this virus. You are considered at high risk for hepatitis B if:  You were born in a country where hepatitis B occurs often. Talk with your health care provider about which countries are considered high risk.  Your parents were born in a high-risk country and you have not received a shot to protect against hepatitis B (hepatitis B vaccine).  You have HIV or AIDS.  You use  needles to inject street drugs.  You live with, or have sex with, someone who has hepatitis B.  You are a man who has sex with other men (MSM).  You get hemodialysis treatment.  You take certain medicines for conditions such as cancer, organ transplantation, and autoimmune conditions.  Hepatitis C blood testing is recommended for all people born from [birth year] through 1965 and any individual with known risks for hepatitis C.  Practice safe sex. Use condoms and avoid high-risk sexual practices to reduce the spread of sexually transmitted infections (STIs). STIs include gonorrhea, chlamydia, syphilis, trichomonas, herpes, HPV, and human immunodeficiency virus (HIV). Herpes, HIV, and HPV are viral illnesses that have no cure. They can result in disability, cancer, and death.  If you are at risk of being infected with HIV, it is recommended that you take a prescription medicine daily to prevent HIV infection. This is called preexposure prophylaxis (PrEP). You are considered at risk if:  You are a  man who has sex with other men (MSM) and have other risk factors.  You are a heterosexual man, are sexually active, and are at increased risk for HIV infection.  You take drugs by injection.  You are sexually active with a partner who has HIV.  Talk with your health care provider about whether you are at high risk of being infected with HIV. If you choose to begin PrEP, you should first be tested for HIV. You should then be tested every 3 months for as long as you are taking PrEP.  A one-time screening for abdominal aortic aneurysm (AAA) and surgical repair of large AAAs by ultrasound are recommended for men ages 51 to 27 years who are current or former smokers.  Healthy men should no longer receive prostate-specific antigen (PSA) blood tests as part of routine cancer screening. Talk with your health care provider about prostate cancer screening.  Testicular cancer screening is not recommended for adult  males who have no symptoms. Screening includes self-exam, a health care provider exam, and other screening tests. Consult with your health care provider about any symptoms you have or any concerns you have about testicular cancer.  Use sunscreen. Apply sunscreen liberally and repeatedly throughout the day. You should seek shade when your shadow is shorter than you. Protect yourself by wearing long sleeves, pants, a wide-brimmed hat, and sunglasses year round, whenever you are outdoors.  Once a month, do a whole-body skin exam, using a mirror to look at the skin on your back. Tell your health care provider about new moles, moles that have irregular borders, moles that are larger than a pencil eraser, or moles that have changed in shape or color.  Stay current with required vaccines (immunizations).  Influenza vaccine. All adults should be immunized every year.  Tetanus, diphtheria, and acellular pertussis (Td, Tdap) vaccine. An adult who has not previously received Tdap or who does not know his vaccine status should receive 1 dose of Tdap. This initial dose should be followed by tetanus and diphtheria toxoids (Td) booster doses every 10 years. Adults with an unknown or incomplete history of completing a 3-dose immunization series with Td-containing vaccines should begin or complete a primary immunization series including a Tdap dose. Adults should receive a Td booster every 10 years.  Varicella vaccine. An adult without evidence of immunity to varicella should receive 2 doses or a second dose if he has previously received 1 dose.  Human papillomavirus (HPV) vaccine. Males aged 14-21 years who have not received the vaccine previously should receive the 3-dose series. Males aged 22-26 years may be immunized. Immunization is recommended through the age of 40 years for any male who has sex with males and did not get any or all doses earlier. Immunization is recommended for any person with an  immunocompromised condition through the age of 21 years if he did not get any or all doses earlier. During the 3-dose series, the second dose should be obtained 4-8 weeks after the first dose. The third dose should be obtained 24 weeks after the first dose and 16 weeks after the second dose.  Zoster vaccine. One dose is recommended for adults aged 34 years or older unless certain conditions are present.  Measles, mumps, and rubella (MMR) vaccine. Adults born before [birth year] generally are considered immune to measles and mumps. Adults born in [birth year] or later should have 1 or more doses of MMR vaccine unless there is a contraindication to the vaccine or there is laboratory  evidence of immunity to each of the three diseases. A routine second dose of MMR vaccine should be obtained at least 28 days after the first dose for students attending postsecondary schools, health care workers, or international travelers. People who received inactivated measles vaccine or an unknown type of measles vaccine during 1963-1967 should receive 2 doses of MMR vaccine. People who received inactivated mumps vaccine or an unknown type of mumps vaccine before 1979 and are at high risk for mumps infection should consider immunization with 2 doses of MMR vaccine. Unvaccinated health care workers born before [birth year] who lack laboratory evidence of measles, mumps, or rubella immunity or laboratory confirmation of disease should consider measles and mumps immunization with 2 doses of MMR vaccine or rubella immunization with 1 dose of MMR vaccine.  Pneumococcal 13-valent conjugate (PCV13) vaccine. When indicated, a person who is uncertain of his immunization history and has no record of immunization should receive the PCV13 vaccine. An adult aged 15 years or older who has certain medical conditions and has not been previously immunized should receive 1 dose of PCV13 vaccine. This PCV13 should be followed with a dose of pneumococcal polysaccharide  (PPSV23) vaccine. The PPSV23 vaccine dose should be obtained at least 8 weeks after the dose of PCV13 vaccine. An adult aged 52 years or older who has certain medical conditions and previously received 1 or more doses of PPSV23 vaccine should receive 1 dose of PCV13. The PCV13 vaccine dose should be obtained 1 or more years after the last PPSV23 vaccine dose.  Pneumococcal polysaccharide (PPSV23) vaccine. When PCV13 is also indicated, PCV13 should be obtained first. All adults aged 42 years and older should be immunized. An adult younger than age 37 years who has certain medical conditions should be immunized. Any person who resides in a nursing home or long-term care facility should be immunized. An adult smoker should be immunized. People with an immunocompromised condition and certain other conditions should receive both PCV13 and PPSV23 vaccines. People with human immunodeficiency virus (HIV) infection should be immunized as soon as possible after diagnosis. Immunization during chemotherapy or radiation therapy should be avoided. Routine use of PPSV23 vaccine is not recommended for American Indians, [place] Natives, or people younger than 65 years unless there are medical conditions that require PPSV23 vaccine. When indicated, people who have unknown immunization and have no record of immunization should receive PPSV23 vaccine. One-time revaccination 5 years after the first dose of PPSV23 is recommended for people aged 19-64 years who have chronic kidney failure, nephrotic syndrome, asplenia, or immunocompromised conditions. People who received 1-2 doses of PPSV23 before age 58 years should receive another dose of PPSV23 vaccine at age 81 years or later if at least 5 years have passed since the previous dose. Doses of PPSV23 are not needed for people immunized with PPSV23 at or after age 30 years.  Meningococcal vaccine. Adults with asplenia or persistent complement component deficiencies should receive 2  doses of quadrivalent meningococcal conjugate (MenACWY-D) vaccine. The doses should be obtained at least 2 months apart. Microbiologists working with certain meningococcal bacteria, [place] recruits, people at risk during an outbreak, and people who travel to or live in countries with a high rate of meningitis should be immunized. A first-year college student up through age 70 years who is living in a residence hall should receive a dose if he did not receive a dose on or after his 16th birthday. Adults who have certain high-risk conditions should receive one or more doses  of vaccine.  Hepatitis A vaccine. Adults who wish to be protected from this disease, have certain high-risk conditions, work with hepatitis A-infected animals, work in hepatitis A research labs, or travel to or work in countries with a high rate of hepatitis A should be immunized. Adults who were previously unvaccinated and who anticipate close contact with an international adoptee during the first 60 days after arrival in the [place] from a country with a high rate of hepatitis A should be immunized.  Hepatitis B vaccine. Adults should be immunized if they wish to be protected from this disease, have certain high-risk conditions, may be exposed to blood or other infectious body fluids, are household contacts or sex partners of hepatitis B positive people, are clients or workers in certain care facilities, or travel to or work in countries with a high rate of hepatitis B.  Haemophilus influenzae type b (Hib) vaccine. A previously unvaccinated person with asplenia or sickle cell disease or having a scheduled splenectomy should receive 1 dose of Hib vaccine. Regardless of previous immunization, a recipient of a hematopoietic stem cell transplant should receive a 3-dose series 6-12 months after his successful transplant. Hib vaccine is not recommended for adults with HIV infection. Preventive Service / Frequency Ages 11 to 63  Blood  pressure check.** / Every 1 to 2 years.  Lipid and cholesterol check.** / Every 5 years beginning at age 73.  Hepatitis C blood test.** / For any individual with known risks for hepatitis C.  Skin self-exam. / Monthly.  Influenza vaccine. / Every year.  Tetanus, diphtheria, and acellular pertussis (Tdap, Td) vaccine.** / Consult your health care provider. 1 dose of Td every 10 years.  Varicella vaccine.** / Consult your health care provider.  HPV vaccine. / 3 doses over 6 months, if 67 or younger.  Measles, mumps, rubella (MMR) vaccine.** / You need at least 1 dose of MMR if you were born in 1957 or later. You may also need a second dose.  Pneumococcal 13-valent conjugate (PCV13) vaccine.** / Consult your health care provider.  Pneumococcal polysaccharide (PPSV23) vaccine.** / 1 to 2 doses if you smoke cigarettes or if you have certain conditions.  Meningococcal vaccine.** / 1 dose if you are age 69 to 53 years and a Market researcher living in a residence hall, or have one of several medical conditions. You may also need additional booster doses.  Hepatitis A vaccine.** / Consult your health care provider.  Hepatitis B vaccine.** / Consult your health care provider.  Haemophilus influenzae type b (Hib) vaccine.** / Consult your health care provider. Ages 88 to 46  Blood pressure check.** / Every 1 to 2 years.  Lipid and cholesterol check.** / Every 5 years beginning at age 2.  Lung cancer screening. / Every year if you are aged 22-80 years and have a 30-pack-year history of smoking and currently smoke or have quit within the past 15 years. Yearly screening is stopped once you have quit smoking for at least 15 years or develop a health problem that would prevent you from having lung cancer treatment.  Fecal occult blood test (FOBT) of stool. / Every year beginning at age 36 and continuing until age 21. You may not have to do this test if you get a colonoscopy every 10  years.  Flexible sigmoidoscopy** or colonoscopy.** / Every 5 years for a flexible sigmoidoscopy or every 10 years for a colonoscopy beginning at age 83 and continuing until age 68.  Hepatitis  C blood test.** / For all people born from [birth year] through 1965 and any individual with known risks for hepatitis C.  Skin self-exam. / Monthly.  Influenza vaccine. / Every year.  Tetanus, diphtheria, and acellular pertussis (Tdap/Td) vaccine.** / Consult your health care provider. 1 dose of Td every 10 years.  Varicella vaccine.** / Consult your health care provider.  Zoster vaccine.** / 1 dose for adults aged 57 years or older.  Measles, mumps, rubella (MMR) vaccine.** / You need at least 1 dose of MMR if you were born in 1957 or later. You may also need a second dose.  Pneumococcal 13-valent conjugate (PCV13) vaccine.** / Consult your health care provider.  Pneumococcal polysaccharide (PPSV23) vaccine.** / 1 to 2 doses if you smoke cigarettes or if you have certain conditions.  Meningococcal vaccine.** / Consult your health care provider.  Hepatitis A vaccine.** / Consult your health care provider.  Hepatitis B vaccine.** / Consult your health care provider.  Haemophilus influenzae type b (Hib) vaccine.** / Consult your health care provider. Ages 62 and over  Blood pressure check.** / Every 1 to 2 years.  Lipid and cholesterol check.**/ Every 5 years beginning at age 3.  Lung cancer screening. / Every year if you are aged 22-80 years and have a 30-pack-year history of smoking and currently smoke or have quit within the past 15 years. Yearly screening is stopped once you have quit smoking for at least 15 years or develop a health problem that would prevent you from having lung cancer treatment.  Fecal occult blood test (FOBT) of stool. / Every year beginning at age 11 and continuing until age 15. You may not have to do this test if you get a colonoscopy every 10 years.  Flexible  sigmoidoscopy** or colonoscopy.** / Every 5 years for a flexible sigmoidoscopy or every 10 years for a colonoscopy beginning at age 28 and continuing until age 65.  Hepatitis C blood test.** / For all people born from [birth year] through 1967 and any individual with known risks for hepatitis C.  Abdominal aortic aneurysm (AAA) screening.** / A one-time screening for ages 1 to 54 years who are current or former smokers.  Skin self-exam. / Monthly.  Influenza vaccine. / Every year.  Tetanus, diphtheria, and acellular pertussis (Tdap/Td) vaccine.** / 1 dose of Td every 10 years.  Varicella vaccine.** / Consult your health care provider.  Zoster vaccine.** / 1 dose for adults aged 39 years or older.  Pneumococcal 13-valent conjugate (PCV13) vaccine.** / Consult your health care provider.  Pneumococcal polysaccharide (PPSV23) vaccine.** / 1 dose for all adults aged 81 years and older.  Meningococcal vaccine.** / Consult your health care provider.  Hepatitis A vaccine.** / Consult your health care provider.  Hepatitis B vaccine.** / Consult your health care provider.  Haemophilus influenzae type b (Hib) vaccine.** / Consult your health care provider. **Family history and personal history of risk and conditions may change your health care provider's recommendations. Document Released: 12/09/2001 Document Revised: 10/18/2013 Document Reviewed: 03/10/2011 [facility] Patient Information 2015 [place], [place]. This information is not intended to replace advice given to you by your health care provider. Make sure you discuss any questions you have with your health care provider.

## 2014-09-19 NOTE — Assessment & Plan Note (Signed)
Possibly related to low vitamin D.  Will obtain lab workup today to include CBC, TSH, Vitamin D and Testosterone levels.  Will treat accordingly.

## 2014-09-19 NOTE — Assessment & Plan Note (Signed)
Doing very well with current regimen.  Follow-up with Dr. Delice Lesch as scheduled.

## 2014-09-19 NOTE — Assessment & Plan Note (Signed)
Will check fasting lipid panel at today's visit.  Continue diet and exercise.

## 2014-09-20 ENCOUNTER — Telehealth: Payer: Self-pay | Admitting: Physician Assistant

## 2014-09-20 DIAGNOSIS — L409 Psoriasis, unspecified: Secondary | ICD-10-CM

## 2014-09-20 LAB — TESTOSTERONE, FREE, TOTAL, SHBG
Sex Hormone Binding: 29 nmol/L (ref 13–71)
TESTOSTERONE FREE: 40.1 pg/mL — AB (ref 47.0–244.0)
TESTOSTERONE-% FREE: 2 % (ref 1.6–2.9)
Testosterone: 196 ng/dL — ABNORMAL LOW (ref 300–890)

## 2014-09-20 MED ORDER — CALCIPOTRIENE-BETAMETH DIPROP 0.005-0.064 % EX OINT: 1.0000 "application " | TOPICAL_OINTMENT | Freq: Every day | CUTANEOUS | Status: DC

## 2014-09-20 MED ORDER — CALCIPOTRIENE-BETAMETH DIPROP 0.005-0.064 % EX SUSP: Freq: Every day | CUTANEOUS | Status: DC

## 2014-09-20 NOTE — Telephone Encounter (Signed)
Caller name: Ennis Relation to pt: self Call back number: [phone] [facility]  Reason for call:   Patient wants all medications that were sent in yesterday [facility]

## 2014-09-20 NOTE — Telephone Encounter (Signed)
Rx request done/[facility]

## 2014-09-25 LAB — VITAMIN D 1,25 DIHYDROXY

## 2014-12-21 ENCOUNTER — Other Ambulatory Visit: Payer: Self-pay | Admitting: *Deleted

## 2014-12-21 ENCOUNTER — Other Ambulatory Visit: Payer: Self-pay | Admitting: Neurology

## 2014-12-21 MED ORDER — PHENYTOIN SODIUM EXTENDED 100 MG PO CAPS: ORAL_CAPSULE | ORAL | Status: DC

## 2014-12-21 NOTE — Telephone Encounter (Signed)
[facility]  [facility] supply

## 2015-01-16 ENCOUNTER — Ambulatory Visit: Payer: Managed Care, Other (non HMO) | Admitting: Physician Assistant

## 2015-01-24 ENCOUNTER — Encounter: Payer: Self-pay | Admitting: Physician Assistant

## 2015-01-24 ENCOUNTER — Telehealth: Payer: Self-pay | Admitting: Physician Assistant

## 2015-01-24 NOTE — Telephone Encounter (Signed)
PT was no show for acute visit on 01/16/15- letter sent- charge?

## 2015-01-24 NOTE — Telephone Encounter (Signed)
Does not have hx of no-show with this provider.  Will not charge this time, but if this becomes a habit, will certainly charge.

## 2015-07-16 ENCOUNTER — Telehealth: Payer: Self-pay | Admitting: Neurology

## 2015-07-16 MED ORDER — PHENYTOIN SODIUM EXTENDED 100 MG PO CAPS: ORAL_CAPSULE | ORAL | Status: DC

## 2015-07-16 NOTE — Telephone Encounter (Signed)
Pt called in regards to his medication dilantin and needs a refill called in/Dawn CB# [phone]

## 2015-07-16 NOTE — Telephone Encounter (Signed)
Called patient he needs refill on Dilantin. Also informed patient that he is due for an ov he hasn't been seen in over a year. Follow-up appt was scheduled for 11/29. Will send in refills to last until f/u appt.

## 2015-08-13 ENCOUNTER — Telehealth: Payer: Self-pay | Admitting: Neurology

## 2015-08-13 NOTE — Telephone Encounter (Signed)
Pt  called for a seizure med refill/[facility]/their # is [phone]

## 2015-08-13 NOTE — Telephone Encounter (Signed)
Called patient lmovm that 90 day rx was sent to his pharmacy on 9/19, should still have pills left.

## 2015-08-13 NOTE — Telephone Encounter (Signed)
PT called and needs a refill on his medication Dilantin/Dawn CB# [phone] time to call back he said is between 11:30-12:00)

## 2015-09-25 ENCOUNTER — Ambulatory Visit (INDEPENDENT_AMBULATORY_CARE_PROVIDER_SITE_OTHER): Payer: Managed Care, Other (non HMO) | Admitting: Neurology

## 2015-09-25 ENCOUNTER — Encounter: Payer: Self-pay | Admitting: Neurology

## 2015-09-25 VITALS — BP 118/76 | HR 67 | Wt 197.0 lb

## 2015-09-25 DIAGNOSIS — G40B09 Juvenile myoclonic epilepsy, not intractable, without status epilepticus: Secondary | ICD-10-CM | POA: Insufficient documentation

## 2015-09-25 MED ORDER — PHENYTOIN SODIUM EXTENDED 100 MG PO CAPS
ORAL_CAPSULE | ORAL | Status: DC
Start: 2015-09-25 — End: 2016-10-06

## 2015-09-25 NOTE — Progress Notes (Signed)
NEUROLOGY FOLLOW UP OFFICE NOTE  [name] WE:[id number]  HISTORY OF PRESENT ILLNESS: I had the pleasure of seeing Jacob Morales in follow-up in the neurology clinic on 09/25/2015. He was last seen more than a year ago for primary generalized epilepsy. Seizures are well-controlled on Dilantin 500mg  qhs with no seizures since age 54.  He occasionally misses a dose with no recurrence of body jerks or convulsions. He has some problems remembering to take the calcium with vitamin D. Bone density scan in 2015 was normal. He has occasional headaches from lack of sleep, usually getting 5-6 hours of sleep. He rarely drinks alcohol. He denies any dizziness, diplopia, focal numbness/tingling/weakness, no falls. Memory is excellent for numbers at work, but he sometimes cannot remember words.  Seizure History: This is a very pleasant 54 yo RH man with a history of seizures since age 34. He reports hitting the back of his head at age 22, there was no loss of consciousness at that time, then a few months later he had his first seizure described as twitching of the arms. This did not progress to a generalized tonic-clonic seizure. A few months later, he had 2 more episodes of body twitching, one time he fell due to leg twitching. He was diagnosed with seizures at that time and started on Dilantin. His first generalized tonic-clonic seizure occurred at age 78, in the setting of stopping his medication. His mother had described a blank stare and then a generalized convulsion. He has had 3 GTCs in his lifetime, provoked by sleep deprivation and missing medications. The last GTC was at age 18. He reports that if he would miss Dilantin for a week, he would feel the twitching coming on. The last time this happened was several years ago. He denies any staring or unresponsive episodes, gaps in time, olfactory or gustatory hallucinations, focal numbness, tingling, or weakness. He has been taking Dilantin 500 mg at  bedtime for many years.  He denies any headaches, dizziness, diplopia, dysarthria, dysphasia, neck or back pain, bowel or bladder dysfunction. He denies any falls, fractures, gum problems. He currently works as an Insurance underwriter.   Epilepsy Risk Factors: He had a normal birth and early development. There is no history of febrile convulsions, CNS infections such as meningitis/encephalitis, significant traumatic brain injury, neurosurgical procedures, or family history of seizures.   Prior AEDs: None except Dilantin  Diagnostic Data: Last EEG and MRI were more than 10 years ago in [place], he vaguely recalls her the EEG as being abnormal.   PAST MEDICAL HISTORY: Past Medical History  Diagnosis Date  . Seizure disorder ([place])   . Psoriasis     MEDICATIONS: Current Outpatient Prescriptions on File Prior to Visit  Medication Sig Dispense Refill  . calcipotriene-betamethasone (TACLONEX SCALP) external suspension Apply topically daily. 60 g 0  . calcipotriene-betamethasone (TACLONEX) ointment Apply 1 application topically daily. 60 g 3  . phenytoin (DILANTIN) 100 MG ER capsule Take 5 capsules by mouth at bedtime 450 capsule 0         No current facility-administered medications on file prior to visit.    ALLERGIES: Allergies  Allergen Reactions  . Tricor [Fenofibrate] Rash    FAMILY HISTORY: Family History  Problem Relation Age of Onset  . Alcohol abuse Father   . Cancer Maternal Grandfather     SOCIAL HISTORY: Social History   Social History  . Marital Status: Single    Spouse Name: N/A  . Number of  Children: N/A  . Years of Education: N/A   Occupational History  . Not on file.   Social History Main Topics  . Smoking status: Never Smoker   . Smokeless tobacco: Never Used  . Alcohol Use: 0.0 oz/week    0 Standard drinks or equivalent per week     Comment: occasional  . Drug Use: No  . Sexual Activity: Yes    Birth Control/ Protection: Condom   Other  Topics Concern  . Not on file   Social History Narrative    REVIEW OF SYSTEMS: Constitutional: No fevers, chills, or sweats, generalized fatigue, no change in appetite Eyes: No visual changes, double vision, eye pain Ear, nose and throat: No hearing loss, ear pain, nasal congestion, sore throat Cardiovascular: No chest pain, palpitations Respiratory:  No shortness of breath at rest or with exertion, wheezes GastrointestinaI: No nausea, vomiting, diarrhea, abdominal pain, fecal incontinence Genitourinary:  No dysuria, urinary retention or frequency Musculoskeletal:  No neck pain, back pain Integumentary: No rash, pruritus, skin lesions Neurological: as above Psychiatric: No depression, insomnia, anxiety Endocrine: No palpitations, fatigue, diaphoresis, mood swings, change in appetite, change in weight, increased thirst Hematologic/Lymphatic:  No anemia, purpura, petechiae. Allergic/Immunologic: no itchy/runny eyes, nasal congestion, recent allergic reactions, rashes  PHYSICAL EXAM: Filed Vitals:   09/25/15 1519  BP: 118/76  Pulse: 67   General: No acute distress Head:  Normocephalic/atraumatic Neck: supple, no paraspinal tenderness, full range of motion Heart:  Regular rate and rhythm Lungs:  Clear to auscultation bilaterally Back: No paraspinal tenderness Skin/Extremities: No rash, no edema Neurological Exam: alert and oriented to person, place, and time. No aphasia or dysarthria. Fund of knowledge is appropriate.  Recent and remote memory are intact. 2/3 delayed recall.  Attention and concentration are normal.    Able to name objects and repeat phrases. Cranial nerves: Pupils equal, round, reactive to light.  Fundoscopic exam unremarkable, no papilledema. Extraocular movements intact with no nystagmus. Visual fields full. Facial sensation intact. No facial asymmetry. Tongue, uvula, palate midline.  Motor: Bulk and tone normal, muscle strength 5/5 throughout with no pronator drift.   Sensation to light touch intact.  No extinction to double simultaneous stimulation.  Deep tendon reflexes 2+ throughout, toes downgoing.  Finger to nose testing intact.  Gait narrow-based and steady, able to tandem walk adequately.  Romberg negative.  IMPRESSION: This is a very pleasant 54 yo RH man with a history of seizures since age 41. By description, seizures are suggestive of a primary generalized epilepsy with myoclonic jerks and generalized tonic-clonic seizures. He denies any history of absence seizures. He has been well controlled on Dilantin 500 mg at bedtime for several years. Last GTC was almost 20 years ago (age 48). Although Dilantin is indicated for partial epilepsy, and he may benefit from more broad-spectrum antiepileptic medications if he were having side effects of the Dilantin, since seizures have been well controlled on the medication and he is not having any side effects, we have agreed to continue Dilantin at this time. Continue daily calcium with vitamin D. We again discussed avoidance of seizure triggers, including missing medications, alcohol, and sleep deprivation were discussed. [place] driving laws were again discussed with the patient, and he knows to stop driving after a seizure, until 6 months seizure-free. He will follow-up annually and knows to call our office for any problems in the interim.  Thank you for allowing me to participate in his care.  Please do not hesitate to call for any  questions or concerns.  The duration of this appointment visit was 25 minutes of face-to-face time with the patient.  Greater than 50% of this time was spent in counseling, explanation of diagnosis, planning of further management, and coordination of care.   Ellouise Newer, M.D.

## 2015-09-25 NOTE — Patient Instructions (Signed)
1. Continue Dilantin 100mg : Take 5 tablets at night 2. Take daily calcium 500mg  with vitamin D 600 IU  3. Keep an alarm on phone to take medications regularly 4. Follow-up in 1 year, call for any problems  Seizure Precautions: 1. If medication has been prescribed for you to prevent seizures, take it exactly as directed.  Do not stop taking the medicine without talking to your doctor first, even if you have not had a seizure in a long time.   2. Avoid activities in which a seizure would cause danger to yourself or to others.  Don't operate dangerous machinery, swim alone, or climb in high or dangerous places, such as on ladders, roofs, or girders.  Do not drive unless your doctor says you may.  3. If you have any warning that you may have a seizure, lay down in a safe place where you can't hurt yourself.    4.  No driving for 6 months from last seizure, as per [facility].   Please refer to the following link on the [place] website for more information: http://[email or web address].org/[email or web address]/driving/drivingu.cfm   5.  Maintain good sleep hygiene. Avoid alcohol.  6.  Contact your doctor if you have any problems that may be related to the medicine you are taking.  7.  Call 911 and bring the patient back to the ED if:        A.  The seizure lasts longer than 5 minutes.       B.  The patient doesn't awaken shortly after the seizure  C.  The patient has new problems such as difficulty seeing, speaking or moving  D.  The patient was injured during the seizure  E.  The patient has a temperature over 102 F (39C)  F.  The patient vomited and now is having trouble breathing

## 2015-11-12 ENCOUNTER — Encounter (INDEPENDENT_AMBULATORY_CARE_PROVIDER_SITE_OTHER): Payer: Self-pay

## 2015-11-12 ENCOUNTER — Telehealth: Payer: Self-pay | Admitting: Physician Assistant

## 2015-11-12 NOTE — Telephone Encounter (Signed)
LVM for pt to schedule an appt for refill.

## 2015-11-12 NOTE — Telephone Encounter (Signed)
Caller name: Jess Relation to pt: Self Call back number: [phone] Pharmacy: [id number] - [place], [place] - [address]  Reason for call: Pt came in office requesting needing rx for calcipotriene-betamethasone (TACLONEX SCALP) external suspension and for  calcipotriene-betamethasone (TACLONEX) ointment. Please advise.

## 2015-11-12 NOTE — Telephone Encounter (Signed)
Has not been seen in over 1 year so he needs appt before any refills will be given.

## 2015-11-12 NOTE — Telephone Encounter (Signed)
Gave pt appt on Wed 11-14-15

## 2015-11-14 ENCOUNTER — Encounter: Payer: Self-pay | Admitting: Physician Assistant

## 2015-11-14 ENCOUNTER — Telehealth: Payer: Self-pay | Admitting: Physician Assistant

## 2015-11-14 ENCOUNTER — Ambulatory Visit (INDEPENDENT_AMBULATORY_CARE_PROVIDER_SITE_OTHER): Payer: Managed Care, Other (non HMO) | Admitting: Physician Assistant

## 2015-11-14 VITALS — BP 126/78 | HR 70 | Temp 98.2°F | Ht 69.0 in | Wt 203.2 lb

## 2015-11-14 DIAGNOSIS — L409 Psoriasis, unspecified: Secondary | ICD-10-CM

## 2015-11-14 MED ORDER — CALCIPOTRIENE-BETAMETH DIPROP 0.005-0.064 % EX SUSP: Freq: Every day | CUTANEOUS | Status: DC

## 2015-11-14 MED ORDER — CALCIPOTRIENE-BETAMETH DIPROP 0.005-0.064 % EX OINT
1.0000 | TOPICAL_OINTMENT | Freq: Every day | CUTANEOUS | Status: DC
Start: 2015-11-14 — End: 2017-02-09

## 2015-11-14 NOTE — Telephone Encounter (Signed)
Pt's CPE is scheduled for 4:30 on 12/19/15

## 2015-11-14 NOTE — Assessment & Plan Note (Signed)
Will restart medications as directed. Will follow-up next month for CPE and we will reassess at that time. He is aware he will need to be seen at least once every year for medications to be managed.

## 2015-11-14 NOTE — Progress Notes (Signed)
    Patient presents to clinic today for medication management. Patient with history of psoriasis, previously well controlled with Taclonex. Has been taking as directed but ran out of medication over 2 months ago. Notes worsening of skin since that time.   Past Medical History  Diagnosis Date  . Seizure disorder ([place])   . Psoriasis     Current Outpatient Prescriptions on File Prior to Visit  Medication Sig Dispense Refill  . phenytoin (DILANTIN) 100 MG ER capsule Take 5 capsules by mouth at bedtime 450 capsule 3   No current facility-administered medications on file prior to visit.    Allergies  Allergen Reactions  . Tricor [Fenofibrate] Rash    Family History  Problem Relation Age of Onset  . Alcohol abuse Father   . Cancer Maternal Grandfather     Social History   Social History  . Marital Status: Single    Spouse Name: N/A  . Number of Children: N/A  . Years of Education: N/A   Social History Main Topics  . Smoking status: Never Smoker   . Smokeless tobacco: Never Used  . Alcohol Use: 0.0 oz/week    0 Standard drinks or equivalent per week     Comment: occasional  . Drug Use: No  . Sexual Activity: Yes    Birth Control/ Protection: Condom   Other Topics Concern  . None   Social History Narrative    Review of Systems - See HPI.  All other ROS are negative.  BP 126/78 mmHg  Pulse 70  Temp(Src) 98.2 F (36.8 C) (Oral)  Ht 5\' 9"  (1.753 m)  Wt 203 lb 3.2 oz (92.171 kg)  BMI 29.99 kg/m2  SpO2 97%  Physical Exam  Constitutional: He is well-developed, well-nourished, and in no distress.  HENT:  Head: Normocephalic and atraumatic.  Cardiovascular: Normal rate, regular rhythm, normal heart sounds and intact distal pulses.   Pulmonary/Chest: Effort normal.  Skin: Skin is warm and dry.     Psychiatric: Affect normal.  Vitals reviewed.   No results found for this or any previous visit (from the past 2160 hour(s)).  Assessment/Plan: Psoriasis Will  restart medications as directed. Will follow-up next month for CPE and we will reassess at that time. He is aware he will need to be seen at least once every year for medications to be managed.

## 2015-11-14 NOTE — Progress Notes (Signed)
Pre visit review using our clinic review tool, if applicable. No additional management support is needed unless otherwise documented below in the visit note. 

## 2015-11-14 NOTE — Patient Instructions (Signed)
Please restart your medications taking as directed. Use a moisturizing cream on the skin.  Follow-up with me in the next month for a complete physical.

## 2015-11-19 ENCOUNTER — Other Ambulatory Visit: Payer: Self-pay | Admitting: Physician Assistant

## 2015-11-19 DIAGNOSIS — Z299 Encounter for prophylactic measures, unspecified: Secondary | ICD-10-CM

## 2015-11-19 DIAGNOSIS — Z125 Encounter for screening for malignant neoplasm of prostate: Secondary | ICD-10-CM

## 2015-11-19 NOTE — Telephone Encounter (Signed)
Lab orders placed.  

## 2015-12-18 ENCOUNTER — Telehealth: Payer: Self-pay | Admitting: *Deleted

## 2015-12-18 NOTE — Telephone Encounter (Signed)
Unable to reach patient at time of pre-visit call. Left message for patient to return call when available.  

## 2015-12-19 ENCOUNTER — Encounter: Payer: Self-pay | Admitting: Physician Assistant

## 2015-12-19 ENCOUNTER — Ambulatory Visit (INDEPENDENT_AMBULATORY_CARE_PROVIDER_SITE_OTHER): Payer: Managed Care, Other (non HMO) | Admitting: Physician Assistant

## 2015-12-19 VITALS — BP 120/70 | HR 76 | Temp 98.0°F | Ht 69.0 in | Wt 200.0 lb

## 2015-12-19 DIAGNOSIS — Z0000 Encounter for general adult medical examination without abnormal findings: Secondary | ICD-10-CM

## 2015-12-19 DIAGNOSIS — Z1211 Encounter for screening for malignant neoplasm of colon: Secondary | ICD-10-CM | POA: Diagnosis not present

## 2015-12-19 DIAGNOSIS — R7989 Other specified abnormal findings of blood chemistry: Secondary | ICD-10-CM

## 2015-12-19 DIAGNOSIS — Z125 Encounter for screening for malignant neoplasm of prostate: Secondary | ICD-10-CM

## 2015-12-19 NOTE — Assessment & Plan Note (Signed)
DRE normal. Will check PSA level. Discussed BPH symptoms. Does not wish to proceed with medication

## 2015-12-19 NOTE — Assessment & Plan Note (Signed)
Depression screen negative. Health Maintenance reviewed -- Declines flu shot. Due for colonoscopy. Referral placed. Preventive schedule discussed and handout given in AVS. Will obtain fasting labs today.

## 2015-12-19 NOTE — Patient Instructions (Signed)
Please go to the lab for blood work.  I will call you with your results. If your blood work is normal we will follow-up yearly for physicals.   If anything is abnormal we will call you with your results.  Please continue medications as directed.  You will be contacted for a screening colonoscopy.  Preventive Care for Adults, Male A healthy lifestyle and preventive care can promote health and wellness. Preventive health guidelines for men include the following key practices:  A routine yearly physical is a good way to check with your health care provider about your health and preventative screening. It is a chance to share any concerns and updates on your health and to receive a thorough exam.  Visit your dentist for a routine exam and preventative care every 6 months. Brush your teeth twice a day and floss once a day. Good oral hygiene prevents tooth decay and gum disease.  The frequency of eye exams is based on your age, health, family medical history, use of contact lenses, and other factors. Follow your health care provider's recommendations for frequency of eye exams.  Eat a healthy diet. Foods such as vegetables, fruits, whole grains, low-fat dairy products, and lean protein foods contain the nutrients you need without too many calories. Decrease your intake of foods high in solid fats, added sugars, and salt. Eat the right amount of calories for you.Get information about a proper diet from your health care provider, if necessary.  Regular physical exercise is one of the most important things you can do for your health. Most adults should get at least 150 minutes of moderate-intensity exercise (any activity that increases your heart rate and causes you to sweat) each week. In addition, most adults need muscle-strengthening exercises on 2 or more days a week.  Maintain a healthy weight. The body mass index (BMI) is a screening tool to identify possible weight problems. It provides an  estimate of body fat based on height and weight. Your health care provider can find your BMI and can help you achieve or maintain a healthy weight.For adults 20 years and older:  A BMI below 18.5 is considered underweight.  A BMI of 18.5 to 24.9 is normal.  A BMI of 25 to 29.9 is considered overweight.  A BMI of 30 and above is considered obese.  Maintain normal blood lipids and cholesterol levels by exercising and minimizing your intake of saturated fat. Eat a balanced diet with plenty of fruit and vegetables. Blood tests for lipids and cholesterol should begin at age 32 and be repeated every 5 years. If your lipid or cholesterol levels are high, you are over 50, or you are at high risk for heart disease, you may need your cholesterol levels checked more frequently.Ongoing high lipid and cholesterol levels should be treated with medicines if diet and exercise are not working.  If you smoke, find out from your health care provider how to quit. If you do not use tobacco, do not start.  Lung cancer screening is recommended for adults aged 43-80 years who are at high risk for developing lung cancer because of a history of smoking. A yearly low-dose CT scan of the lungs is recommended for people who have at least a 30-pack-year history of smoking and are a current smoker or have quit within the past 15 years. A pack year of smoking is smoking an average of 1 pack of cigarettes a day for 1 year (for example: 1 pack a day for  30 years or 2 packs a day for 15 years). Yearly screening should continue until the smoker has stopped smoking for at least 15 years. Yearly screening should be stopped for people who develop a health problem that would prevent them from having lung cancer treatment.  If you choose to drink alcohol, do not have more than 2 drinks per day. One drink is considered to be 12 ounces (355 mL) of beer, 5 ounces (148 mL) of wine, or 1.5 ounces (44 mL) of liquor.  Avoid use of street  drugs. Do not share needles with anyone. Ask for help if you need support or instructions about stopping the use of drugs.  High blood pressure causes heart disease and increases the risk of stroke. Your blood pressure should be checked at least every 1-2 years. Ongoing high blood pressure should be treated with medicines, if weight loss and exercise are not effective.  If you are 23-91 years old, ask your health care provider if you should take aspirin to prevent heart disease.  Diabetes screening is done by taking a blood sample to check your blood glucose level after you have not eaten for a certain period of time (fasting). If you are not overweight and you do not have risk factors for diabetes, you should be screened once every 3 years starting at age 5. If you are overweight or obese and you are 56-37 years of age, you should be screened for diabetes every year as part of your cardiovascular risk assessment.  Colorectal cancer can be detected and often prevented. Most routine colorectal cancer screening begins at the age of 77 and continues through age 95. However, your health care provider may recommend screening at an earlier age if you have risk factors for colon cancer. On a yearly basis, your health care provider may provide home test kits to check for hidden blood in the stool. Use of a small camera at the end of a tube to directly examine the colon (sigmoidoscopy or colonoscopy) can detect the earliest forms of colorectal cancer. Talk to your health care provider about this at age 49, when routine screening begins. Direct exam of the colon should be repeated every 5-10 years through age 83, unless early forms of precancerous polyps or small growths are found.  People who are at an increased risk for hepatitis B should be screened for this virus. You are considered at high risk for hepatitis B if:  You were born in a country where hepatitis B occurs often. Talk with your health care provider  about which countries are considered high risk.  Your parents were born in a high-risk country and you have not received a shot to protect against hepatitis B (hepatitis B vaccine).  You have HIV or AIDS.  You use needles to inject street drugs.  You live with, or have sex with, someone who has hepatitis B.  You are a man who has sex with other men (MSM).  You get hemodialysis treatment.  You take certain medicines for conditions such as cancer, organ transplantation, and autoimmune conditions.  Hepatitis C blood testing is recommended for all people born from [birth year] through 1965 and any individual with known risks for hepatitis C.  Practice safe sex. Use condoms and avoid high-risk sexual practices to reduce the spread of sexually transmitted infections (STIs). STIs include gonorrhea, chlamydia, syphilis, trichomonas, herpes, HPV, and human immunodeficiency virus (HIV). Herpes, HIV, and HPV are viral illnesses that have no cure. They can result in  disability, cancer, and death.  If you are a man who has sex with other men, you should be screened at least once per year for:  HIV.  Urethral, rectal, and pharyngeal infection of gonorrhea, chlamydia, or both.  If you are at risk of being infected with HIV, it is recommended that you take a prescription medicine daily to prevent HIV infection. This is called preexposure prophylaxis (PrEP). You are considered at risk if:  You are a man who has sex with other men (MSM) and have other risk factors.  You are a heterosexual man, are sexually active, and are at increased risk for HIV infection.  You take drugs by injection.  You are sexually active with a partner who has HIV.  Talk with your health care provider about whether you are at high risk of being infected with HIV. If you choose to begin PrEP, you should first be tested for HIV. You should then be tested every 3 months for as long as you are taking PrEP.  A one-time screening for  abdominal aortic aneurysm (AAA) and surgical repair of large AAAs by ultrasound are recommended for men ages 58 to 19 years who are current or former smokers.  Healthy men should no longer receive prostate-specific antigen (PSA) blood tests as part of routine cancer screening. Talk with your health care provider about prostate cancer screening.  Testicular cancer screening is not recommended for adult males who have no symptoms. Screening includes self-exam, a health care provider exam, and other screening tests. Consult with your health care provider about any symptoms you have or any concerns you have about testicular cancer.  Use sunscreen. Apply sunscreen liberally and repeatedly throughout the day. You should seek shade when your shadow is shorter than you. Protect yourself by wearing long sleeves, pants, a wide-brimmed hat, and sunglasses year round, whenever you are outdoors.  Once a month, do a whole-body skin exam, using a mirror to look at the skin on your back. Tell your health care provider about new moles, moles that have irregular borders, moles that are larger than a pencil eraser, or moles that have changed in shape or color.  Stay current with required vaccines (immunizations).  Influenza vaccine. All adults should be immunized every year.  Tetanus, diphtheria, and acellular pertussis (Td, Tdap) vaccine. An adult who has not previously received Tdap or who does not know his vaccine status should receive 1 dose of Tdap. This initial dose should be followed by tetanus and diphtheria toxoids (Td) booster doses every 10 years. Adults with an unknown or incomplete history of completing a 3-dose immunization series with Td-containing vaccines should begin or complete a primary immunization series including a Tdap dose. Adults should receive a Td booster every 10 years.  Varicella vaccine. An adult without evidence of immunity to varicella should receive 2 doses or a second dose if he has  previously received 1 dose.  Human papillomavirus (HPV) vaccine. Males aged 11-21 years who have not received the vaccine previously should receive the 3-dose series. Males aged 22-26 years may be immunized. Immunization is recommended through the age of 58 years for any male who has sex with males and did not get any or all doses earlier. Immunization is recommended for any person with an immunocompromised condition through the age of 104 years if he did not get any or all doses earlier. During the 3-dose series, the second dose should be obtained 4-8 weeks after the first dose. The third dose should  be obtained 24 weeks after the first dose and 16 weeks after the second dose.  Zoster vaccine. One dose is recommended for adults aged 32 years or older unless certain conditions are present.  Measles, mumps, and rubella (MMR) vaccine. Adults born before [birth year] generally are considered immune to measles and mumps. Adults born in [birth year] or later should have 1 or more doses of MMR vaccine unless there is a contraindication to the vaccine or there is laboratory evidence of immunity to each of the three diseases. A routine second dose of MMR vaccine should be obtained at least 28 days after the first dose for students attending postsecondary schools, health care workers, or international travelers. People who received inactivated measles vaccine or an unknown type of measles vaccine during 1963-1967 should receive 2 doses of MMR vaccine. People who received inactivated mumps vaccine or an unknown type of mumps vaccine before 1979 and are at high risk for mumps infection should consider immunization with 2 doses of MMR vaccine. Unvaccinated health care workers born before [birth year] who lack laboratory evidence of measles, mumps, or rubella immunity or laboratory confirmation of disease should consider measles and mumps immunization with 2 doses of MMR vaccine or rubella immunization with 1 dose of MMR vaccine.  Pneumococcal  13-valent conjugate (PCV13) vaccine. When indicated, a person who is uncertain of his immunization history and has no record of immunization should receive the PCV13 vaccine. All adults 46 years of age and older should receive this vaccine. An adult aged 46 years or older who has certain medical conditions and has not been previously immunized should receive 1 dose of PCV13 vaccine. This PCV13 should be followed with a dose of pneumococcal polysaccharide (PPSV23) vaccine. Adults who are at high risk for pneumococcal disease should obtain the PPSV23 vaccine at least 8 weeks after the dose of PCV13 vaccine. Adults older than 57 years of age who have normal immune system function should obtain the PPSV23 vaccine dose at least 1 year after the dose of PCV13 vaccine.  Pneumococcal polysaccharide (PPSV23) vaccine. When PCV13 is also indicated, PCV13 should be obtained first. All adults aged 75 years and older should be immunized. An adult younger than age 26 years who has certain medical conditions should be immunized. Any person who resides in a nursing home or long-term care facility should be immunized. An adult smoker should be immunized. People with an immunocompromised condition and certain other conditions should receive both PCV13 and PPSV23 vaccines. People with human immunodeficiency virus (HIV) infection should be immunized as soon as possible after diagnosis. Immunization during chemotherapy or radiation therapy should be avoided. Routine use of PPSV23 vaccine is not recommended for American Indians, [place] Natives, or people younger than 65 years unless there are medical conditions that require PPSV23 vaccine. When indicated, people who have unknown immunization and have no record of immunization should receive PPSV23 vaccine. One-time revaccination 5 years after the first dose of PPSV23 is recommended for people aged 19-64 years who have chronic kidney failure, nephrotic syndrome, asplenia, or  immunocompromised conditions. People who received 1-2 doses of PPSV23 before age 39 years should receive another dose of PPSV23 vaccine at age 57 years or later if at least 5 years have passed since the previous dose. Doses of PPSV23 are not needed for people immunized with PPSV23 at or after age 41 years.  Meningococcal vaccine. Adults with asplenia or persistent complement component deficiencies should receive 2 doses of quadrivalent meningococcal conjugate (MenACWY-D) vaccine. The doses should be  obtained at least 2 months apart. Microbiologists working with certain meningococcal bacteria, [place] recruits, people at risk during an outbreak, and people who travel to or live in countries with a high rate of meningitis should be immunized. A first-year college student up through age 56 years who is living in a residence hall should receive a dose if he did not receive a dose on or after his 16th birthday. Adults who have certain high-risk conditions should receive one or more doses of vaccine.  Hepatitis A vaccine. Adults who wish to be protected from this disease, have chronic liver disease, work with hepatitis A-infected animals, work in hepatitis A research labs, or travel to or work in countries with a high rate of hepatitis A should be immunized. Adults who were previously unvaccinated and who anticipate close contact with an international adoptee during the first 60 days after arrival in the [place] from a country with a high rate of hepatitis A should be immunized.  Hepatitis B vaccine. Adults should be immunized if they wish to be protected from this disease, are under age 73 years and have diabetes, have chronic liver disease, have had more than one sex partner in the past 6 months, may be exposed to blood or other infectious body fluids, are household contacts or sex partners of hepatitis B positive people, are clients or workers in certain care facilities, or travel to or work in countries  with a high rate of hepatitis B.  Haemophilus influenzae type b (Hib) vaccine. A previously unvaccinated person with asplenia or sickle cell disease or having a scheduled splenectomy should receive 1 dose of Hib vaccine. Regardless of previous immunization, a recipient of a hematopoietic stem cell transplant should receive a 3-dose series 6-12 months after his successful transplant. Hib vaccine is not recommended for adults with HIV infection. Preventive Service / Frequency Ages 82 to 39  Blood pressure check.** / Every 3-5 years.  Lipid and cholesterol check.** / Every 5 years beginning at age 33.  Hepatitis C blood test.** / For any individual with known risks for hepatitis C.  Skin self-exam. / Monthly.  Influenza vaccine. / Every year.  Tetanus, diphtheria, and acellular pertussis (Tdap, Td) vaccine.** / Consult your health care provider. 1 dose of Td every 10 years.  Varicella vaccine.** / Consult your health care provider.  HPV vaccine. / 3 doses over 6 months, if 21 or younger.  Measles, mumps, rubella (MMR) vaccine.** / You need at least 1 dose of MMR if you were born in 1957 or later. You may also need a second dose.  Pneumococcal 13-valent conjugate (PCV13) vaccine.** / Consult your health care provider.  Pneumococcal polysaccharide (PPSV23) vaccine.** / 1 to 2 doses if you smoke cigarettes or if you have certain conditions.  Meningococcal vaccine.** / 1 dose if you are age 39 to 57 years and a Market researcher living in a residence hall, or have one of several medical conditions. You may also need additional booster doses.  Hepatitis A vaccine.** / Consult your health care provider.  Hepatitis B vaccine.** / Consult your health care provider.  Haemophilus influenzae type b (Hib) vaccine.** / Consult your health care provider. Ages 74 to 5  Blood pressure check.** / Every year.  Lipid and cholesterol check.** / Every 5 years beginning at age 106.  Lung  cancer screening. / Every year if you are aged 73-80 years and have a 30-pack-year history of smoking and currently smoke or have quit within the  past 15 years. Yearly screening is stopped once you have quit smoking for at least 15 years or develop a health problem that would prevent you from having lung cancer treatment.  Fecal occult blood test (FOBT) of stool. / Every year beginning at age 31 and continuing until age 105. You may not have to do this test if you get a colonoscopy every 10 years.  Flexible sigmoidoscopy** or colonoscopy.** / Every 5 years for a flexible sigmoidoscopy or every 10 years for a colonoscopy beginning at age 52 and continuing until age 55.  Hepatitis C blood test.** / For all people born from [birth year] through 1965 and any individual with known risks for hepatitis C.  Skin self-exam. / Monthly.  Influenza vaccine. / Every year.  Tetanus, diphtheria, and acellular pertussis (Tdap/Td) vaccine.** / Consult your health care provider. 1 dose of Td every 10 years.  Varicella vaccine.** / Consult your health care provider.  Zoster vaccine.** / 1 dose for adults aged 70 years or older.  Measles, mumps, rubella (MMR) vaccine.** / You need at least 1 dose of MMR if you were born in 1957 or later. You may also need a second dose.  Pneumococcal 13-valent conjugate (PCV13) vaccine.** / Consult your health care provider.  Pneumococcal polysaccharide (PPSV23) vaccine.** / 1 to 2 doses if you smoke cigarettes or if you have certain conditions.  Meningococcal vaccine.** / Consult your health care provider.  Hepatitis A vaccine.** / Consult your health care provider.  Hepatitis B vaccine.** / Consult your health care provider.  Haemophilus influenzae type b (Hib) vaccine.** / Consult your health care provider. Ages 76 and over  Blood pressure check.** / Every year.  Lipid and cholesterol check.**/ Every 5 years beginning at age 31.  Lung cancer screening. / Every year if you  are aged 21-80 years and have a 30-pack-year history of smoking and currently smoke or have quit within the past 15 years. Yearly screening is stopped once you have quit smoking for at least 15 years or develop a health problem that would prevent you from having lung cancer treatment.  Fecal occult blood test (FOBT) of stool. / Every year beginning at age 35 and continuing until age 98. You may not have to do this test if you get a colonoscopy every 10 years.  Flexible sigmoidoscopy** or colonoscopy.** / Every 5 years for a flexible sigmoidoscopy or every 10 years for a colonoscopy beginning at age 42 and continuing until age 53.  Hepatitis C blood test.** / For all people born from [birth year] through 1967 and any individual with known risks for hepatitis C.  Abdominal aortic aneurysm (AAA) screening.** / A one-time screening for ages 65 to 39 years who are current or former smokers.  Skin self-exam. / Monthly.  Influenza vaccine. / Every year.  Tetanus, diphtheria, and acellular pertussis (Tdap/Td) vaccine.** / 1 dose of Td every 10 years.  Varicella vaccine.** / Consult your health care provider.  Zoster vaccine.** / 1 dose for adults aged 42 years or older.  Pneumococcal 13-valent conjugate (PCV13) vaccine.** / 1 dose for all adults aged 75 years and older.  Pneumococcal polysaccharide (PPSV23) vaccine.** / 1 dose for all adults aged 58 years and older.  Meningococcal vaccine.** / Consult your health care provider.  Hepatitis A vaccine.** / Consult your health care provider.  Hepatitis B vaccine.** / Consult your health care provider.  Haemophilus influenzae type b (Hib) vaccine.** / Consult your health care provider. **Family history and personal history of  risk and conditions may change your health care provider's recommendations.   This information is not intended to replace advice given to you by your health care provider. Make sure you discuss any questions you have with your  health care provider.   Document Released: 12/09/2001 Document Revised: 11/03/2014 Document Reviewed: 03/10/2011 Elsevier Interactive Patient Education [employer].

## 2015-12-19 NOTE — Progress Notes (Signed)
Patient presents to clinic today for annual exam.  Patient is fasting for labs.  Health Maintenance: Immunizations -- Declines flu shot.  Colonoscopy -- Last in 2011. Endorses polyps on last. Was told to repeat in 5 years. Will referral to GI.  Past Medical History  Diagnosis Date  . Seizure disorder ([place])   . Psoriasis     History reviewed. No pertinent past surgical history.  Current Outpatient Prescriptions on File Prior to Visit  Medication Sig Dispense Refill  . calcipotriene-betamethasone (TACLONEX SCALP) external suspension Apply topically daily. 60 g 3  . calcipotriene-betamethasone (TACLONEX) ointment Apply 1 application topically daily. 60 g 3  . Cholecalciferol (VITAMIN D3) 3000 units TABS Take 1 tablet by mouth daily.    . phenytoin (DILANTIN) 100 MG ER capsule Take 5 capsules by mouth at bedtime 450 capsule 3   No current facility-administered medications on file prior to visit.    Allergies  Allergen Reactions  . Tricor [Fenofibrate] Rash    Family History  Problem Relation Age of Onset  . Alcohol abuse Father   . Cancer Maternal Grandfather     Social History   Social History  . Marital Status: Single    Spouse Name: N/A  . Number of Children: N/A  . Years of Education: N/A   Occupational History  . Not on file.   Social History Main Topics  . Smoking status: Never Smoker   . Smokeless tobacco: Never Used  . Alcohol Use: 0.0 oz/week    0 Standard drinks or equivalent per week     Comment: occasional  . Drug Use: No  . Sexual Activity: Yes    Birth Control/ Protection: Condom   Other Topics Concern  . Not on file   Social History Narrative   Review of Systems  Constitutional: Negative for fever and weight loss.  HENT: Negative for ear discharge, ear pain, hearing loss and tinnitus.   Eyes: Negative for blurred vision, double vision, photophobia and pain.  Respiratory: Negative for cough and shortness of breath.   Cardiovascular:  Negative for chest pain and palpitations.  Gastrointestinal: Negative for heartburn, nausea, vomiting, abdominal pain, diarrhea, constipation, blood in stool and melena.  Genitourinary: Negative for dysuria, urgency, frequency, hematuria and flank pain.       Nocturia x 2-3  Musculoskeletal: Negative for falls.  Neurological: Negative for dizziness, loss of consciousness and headaches.  Endo/Heme/Allergies: Negative for environmental allergies.  Psychiatric/Behavioral: Negative for depression, suicidal ideas, hallucinations and substance abuse. The patient is not nervous/anxious and does not have insomnia.     BP 120/70 mmHg  Pulse 76  Temp(Src) 98 F (36.7 C) (Oral)  Ht 5\' 9"  (1.753 m)  Wt 200 lb (90.719 kg)  BMI 29.52 kg/m2  SpO2 97%  Physical Exam  Constitutional: He is oriented to person, place, and time and well-developed, well-nourished, and in no distress.  HENT:  Head: Normocephalic and atraumatic.  Right Ear: External ear normal.  Left Ear: External ear normal.  Nose: Nose normal.  Mouth/Throat: Oropharynx is clear and moist. No oropharyngeal exudate.  Eyes: Conjunctivae and EOM are normal. Pupils are equal, round, and reactive to light.  Neck: Neck supple. No thyromegaly present.  Cardiovascular: Normal rate, regular rhythm, normal heart sounds and intact distal pulses.   Pulmonary/Chest: Effort normal and breath sounds normal. No respiratory distress. He has no wheezes. He has no rales. He exhibits no tenderness.  Abdominal: Soft. Bowel sounds are normal. He exhibits no distension and  no mass. There is no tenderness. There is no rebound and no guarding.  Genitourinary: Penis normal. Prostate is not enlarged and not tender.  Lymphadenopathy:    He has no cervical adenopathy.  Neurological: He is alert and oriented to person, place, and time.  Skin: Skin is warm and dry. No rash noted.  Psychiatric: Affect normal.  Vitals reviewed.   No results found for this or any  previous visit (from the past 2160 hour(s)).  Assessment/Plan: Visit for preventive health examination Depression screen negative. Health Maintenance reviewed -- Declines flu shot. Due for colonoscopy. Referral placed. Preventive schedule discussed and handout given in AVS. Will obtain fasting labs today.   Prostate cancer screening DRE normal. Will check PSA level. Discussed BPH symptoms. Does not wish to proceed with medication

## 2015-12-19 NOTE — Progress Notes (Signed)
Pre visit review using our clinic review tool, if applicable. No additional management support is needed unless otherwise documented below in the visit note. 

## 2015-12-20 LAB — URINALYSIS, ROUTINE W REFLEX MICROSCOPIC
Bilirubin Urine: NEGATIVE
HGB URINE DIPSTICK: NEGATIVE
Ketones, ur: NEGATIVE
LEUKOCYTES UA: NEGATIVE
Nitrite: NEGATIVE
RBC / HPF: NONE SEEN (ref 0–?)
TOTAL PROTEIN, URINE-UPE24: NEGATIVE
Urine Glucose: NEGATIVE
Urobilinogen, UA: 0.2 (ref 0.0–1.0)
WBC, UA: NONE SEEN (ref 0–?)
pH: 5.5 (ref 5.0–8.0)

## 2015-12-20 LAB — CBC
HCT: 42.8 % (ref 39.0–52.0)
Hemoglobin: 14.7 g/dL (ref 13.0–17.0)
MCHC: 34.3 g/dL (ref 30.0–36.0)
MCV: 87.3 fl (ref 78.0–100.0)
PLATELETS: 214 10*3/uL (ref 150.0–400.0)
RBC: 4.91 Mil/uL (ref 4.22–5.81)
RDW: 13.7 % (ref 11.5–15.5)
WBC: 5.4 10*3/uL (ref 4.0–10.5)

## 2015-12-20 LAB — COMPREHENSIVE METABOLIC PANEL
ALK PHOS: 75 U/L (ref 39–117)
ALT: 31 U/L (ref 0–53)
AST: 26 U/L (ref 0–37)
Albumin: 4.5 g/dL (ref 3.5–5.2)
BILIRUBIN TOTAL: 0.4 mg/dL (ref 0.2–1.2)
BUN: 19 mg/dL (ref 6–23)
CALCIUM: 9.3 mg/dL (ref 8.4–10.5)
CO2: 28 meq/L (ref 19–32)
CREATININE: 0.85 mg/dL (ref 0.40–1.50)
Chloride: 104 mEq/L (ref 96–112)
GFR: 98.84 mL/min (ref >60.00)
GLUCOSE: 82 mg/dL (ref 70–99)
Potassium: 5.2 mEq/L — ABNORMAL HIGH (ref 3.5–5.1)
Sodium: 138 mEq/L (ref 135–145)
Total Protein: 7.3 g/dL (ref 6.0–8.3)

## 2015-12-20 LAB — LIPID PANEL
CHOL/HDL RATIO: 7
Cholesterol: 229 mg/dL — ABNORMAL HIGH (ref 0–200)
HDL: 33 mg/dL — AB (ref >39.00)

## 2015-12-20 LAB — TSH: TSH: 0.89 u[IU]/mL (ref 0.35–4.50)

## 2015-12-20 LAB — PSA: PSA: 1.14 ng/mL (ref 0.10–4.00)

## 2015-12-20 LAB — LDL CHOLESTEROL, DIRECT: Direct LDL: 77 mg/dL

## 2015-12-20 LAB — HEMOGLOBIN A1C: Hgb A1c MFr Bld: 6.1 % (ref 4.6–6.5)

## 2015-12-24 ENCOUNTER — Ambulatory Visit (INDEPENDENT_AMBULATORY_CARE_PROVIDER_SITE_OTHER): Payer: Managed Care, Other (non HMO) | Admitting: Physician Assistant

## 2015-12-24 ENCOUNTER — Encounter: Payer: Self-pay | Admitting: Physician Assistant

## 2015-12-24 VITALS — BP 127/84 | HR 73 | Temp 98.0°F | Ht 69.0 in | Wt 203.2 lb

## 2015-12-24 DIAGNOSIS — H01003 Unspecified blepharitis right eye, unspecified eyelid: Secondary | ICD-10-CM | POA: Diagnosis not present

## 2015-12-24 DIAGNOSIS — H01009 Unspecified blepharitis unspecified eye, unspecified eyelid: Secondary | ICD-10-CM | POA: Insufficient documentation

## 2015-12-24 DIAGNOSIS — E782 Mixed hyperlipidemia: Secondary | ICD-10-CM | POA: Diagnosis not present

## 2015-12-24 DIAGNOSIS — Z23 Encounter for immunization: Secondary | ICD-10-CM | POA: Diagnosis not present

## 2015-12-24 MED ORDER — ERYTHROMYCIN 5 MG/GM OP OINT: 1.0000 "application " | TOPICAL_OINTMENT | Freq: Three times a day (TID) | OPHTHALMIC | Status: DC

## 2015-12-24 MED ORDER — ICOSAPENT ETHYL 1 G PO CAPS: 2.0000 g | ORAL_CAPSULE | Freq: Two times a day (BID) | ORAL | Status: DC

## 2015-12-24 NOTE — Progress Notes (Signed)
Patient presents to clinic today c/o pain and swelling of R lower eyelid with purulent drainage.  Denies fever, chills, change in vision. Denies symptoms of L eye.   Would like to discuss lab results and update TDaP today.  Past Medical History  Diagnosis Date  . Seizure disorder ([place])   . Psoriasis     Current Outpatient Prescriptions on File Prior to Visit  Medication Sig Dispense Refill  . calcipotriene-betamethasone (TACLONEX SCALP) external suspension Apply topically daily. 60 g 3  . calcipotriene-betamethasone (TACLONEX) ointment Apply 1 application topically daily. 60 g 3  . Cholecalciferol (VITAMIN D3) 3000 units TABS Take 1 tablet by mouth daily.    . phenytoin (DILANTIN) 100 MG ER capsule Take 5 capsules by mouth at bedtime 450 capsule 3   No current facility-administered medications on file prior to visit.    Allergies  Allergen Reactions  . Tricor [Fenofibrate] Rash    Family History  Problem Relation Age of Onset  . Alcohol abuse Father   . Cancer Maternal Grandfather     Social History   Social History  . Marital Status: Single    Spouse Name: N/A  . Number of Children: N/A  . Years of Education: N/A   Social History Main Topics  . Smoking status: Never Smoker   . Smokeless tobacco: Never Used  . Alcohol Use: 0.0 oz/week    0 Standard drinks or equivalent per week     Comment: occasional  . Drug Use: No  . Sexual Activity: Yes    Birth Control/ Protection: Condom   Other Topics Concern  . None   Social History Narrative    Review of Systems - See HPI.  All other ROS are negative.  BP 127/84 mmHg  Pulse 73  Temp(Src) 98 F (36.7 C) (Oral)  Ht 5' 9"  (1.753 m)  Wt 203 lb 3.2 oz (92.171 kg)  BMI 29.99 kg/m2  SpO2 99%  Physical Exam  Constitutional: He is oriented to person, place, and time and well-developed, well-nourished, and in no distress.  HENT:  Head: Normocephalic and atraumatic.  Eyes: Conjunctivae and EOM are normal.  Pupils are equal, round, and reactive to light.    Cardiovascular: Normal rate, regular rhythm, normal heart sounds and intact distal pulses.   Pulmonary/Chest: Effort normal.  Neurological: He is alert and oriented to person, place, and time.  Skin: Skin is warm and dry. No rash noted.  Psychiatric: Affect normal.  Vitals reviewed.   Recent Results (from the past 2160 hour(s))  CBC     Status: None   Collection Time: 12/19/15  4:51 PM  Result Value Ref Range   WBC 5.4 4.0 - 10.5 K/uL   RBC 4.91 4.22 - 5.81 Mil/uL   Platelets 214.0 150.0 - 400.0 K/uL   Hemoglobin 14.7 13.0 - 17.0 g/dL   HCT 42.8 39.0 - 52.0 %   MCV 87.3 78.0 - 100.0 fl   MCHC 34.3 30.0 - 36.0 g/dL   RDW 13.7 11.5 - 15.5 %  Comp Met (CMET)     Status: Abnormal   Collection Time: 12/19/15  4:51 PM  Result Value Ref Range   Sodium 138 135 - 145 mEq/L   Potassium 5.2 (H) 3.5 - 5.1 mEq/L   Chloride 104 96 - 112 mEq/L   CO2 28 19 - 32 mEq/L   Glucose, Bld 82 70 - 99 mg/dL   BUN 19 6 - 23 mg/dL   Creatinine, Ser 0.85 0.40 -  1.50 mg/dL   Total Bilirubin 0.4 0.2 - 1.2 mg/dL   Alkaline Phosphatase 75 39 - 117 U/L   AST 26 0 - 37 U/L   ALT 31 0 - 53 U/L   Total Protein 7.3 6.0 - 8.3 g/dL   Albumin 4.5 3.5 - 5.2 g/dL   Calcium 9.3 8.4 - 10.5 mg/dL   GFR 98.84 >60.00 mL/min  TSH     Status: None   Collection Time: 12/19/15  4:51 PM  Result Value Ref Range   TSH 0.89 0.35 - 4.50 uIU/mL  Hemoglobin A1c     Status: None   Collection Time: 12/19/15  4:51 PM  Result Value Ref Range   Hgb A1c MFr Bld 6.1 4.6 - 6.5 %    Comment: Glycemic Control Guidelines for People with Diabetes:Non Diabetic:  <6%Goal of Therapy: <7%Additional Action Suggested:  >8%   Urinalysis, Routine w reflex microscopic     Status: Abnormal   Collection Time: 12/19/15  4:51 PM  Result Value Ref Range   Color, Urine YELLOW Yellow;Lt. Yellow   APPearance Cloudy (A) Clear   Specific Gravity, Urine >=1.030 (A) 1.000 - 1.030   pH 5.5 5.0 - 8.0     Total Protein, Urine NEGATIVE Negative   Urine Glucose NEGATIVE Negative   Ketones, ur NEGATIVE Negative   Bilirubin Urine NEGATIVE Negative   Hgb urine dipstick NEGATIVE Negative   Urobilinogen, UA 0.2 0.0 - 1.0   Leukocytes, UA NEGATIVE Negative   Nitrite NEGATIVE Negative   WBC, UA none seen 0-2/hpf   RBC / HPF none seen 0-2/hpf  PSA     Status: None   Collection Time: 12/19/15  4:51 PM  Result Value Ref Range   PSA 1.14 0.10 - 4.00 ng/mL  Lipid Profile     Status: Abnormal   Collection Time: 12/19/15  4:51 PM  Result Value Ref Range   Cholesterol 229 (H) 0 - 200 mg/dL    Comment: ATP III Classification       Desirable:  < 200 mg/dL               Borderline High:  200 - 239 mg/dL          High:  > = 240 mg/dL   Triglycerides (H) 0.0 - 149.0 mg/dL    506.0 Triglyceride is over 400; calculations on Lipids are invalid.    Comment: Normal:  <150 mg/dLBorderline High:  150 - 199 mg/dL   HDL 33.00 (L) >39.00 mg/dL   Total CHOL/HDL Ratio 7     Comment:                Men          Women1/2 Average Risk     3.4          3.3Average Risk          5.0          4.42X Average Risk          9.6          7.13X Average Risk          15.0          11.0                      LDL cholesterol, direct     Status: None   Collection Time: 12/19/15  4:51 PM  Result Value Ref Range   Direct LDL 77.0 mg/dL  Comment: Optimal:  <100 mg/dLNear or Above Optimal:  100-129 mg/dLBorderline High:  130-159 mg/dLHigh:  160-189 mg/dLVery High:  >190 mg/dL    Assessment/Plan: Blepharitis Rx Romycin ointment to apply as directed. Benadryl or Claritin and cold compresses for swelling. Follow-up if not improving within 24-48 hours.  Need for diphtheria-tetanus-pertussis (Tdap) vaccine TDaP given by nursing staff.  Elevated triglycerides with high cholesterol Reviewed labs with patient. Cannot tolerate fibric acid derivatives. Will begin Vascepa. Supportive measures reviewed.

## 2015-12-24 NOTE — Assessment & Plan Note (Signed)
Rx Romycin ointment to apply as directed. Benadryl or Claritin and cold compresses for swelling. Follow-up if not improving within 24-48 hours.

## 2015-12-24 NOTE — Progress Notes (Signed)
Pre visit review using our clinic review tool, if applicable. No additional management support is needed unless otherwise documented below in the visit note. 

## 2015-12-24 NOTE — Assessment & Plan Note (Signed)
Reviewed labs with patient. Cannot tolerate fibric acid derivatives. Will begin Vascepa. Supportive measures reviewed.

## 2015-12-24 NOTE — Assessment & Plan Note (Signed)
TDaP given by nursing staff. 

## 2015-12-24 NOTE — Patient Instructions (Signed)
Please apply cold compress to eye lid to help with swelling. Daily claritin or Benadryl will also help. Apply antibiotic as directed. Avoid touching eyes or eyelids.  If symptoms are not improving with in 24-48 hours please call or come see me.  For triglycerides -- increase exercise. Take the Vascepa as directed. Follow-up with me in 2 months.

## 2016-02-07 ENCOUNTER — Other Ambulatory Visit: Payer: Self-pay | Admitting: Physician Assistant

## 2016-02-07 NOTE — Telephone Encounter (Signed)
Medication filled to pharmacy as requested.   

## 2016-02-25 ENCOUNTER — Ambulatory Visit: Payer: Managed Care, Other (non HMO) | Admitting: Physician Assistant

## 2016-02-26 ENCOUNTER — Other Ambulatory Visit: Payer: Self-pay | Admitting: Physician Assistant

## 2016-02-26 NOTE — Telephone Encounter (Signed)
Refill sent per [facility] refill protocol/SLS  

## 2016-03-19 ENCOUNTER — Other Ambulatory Visit: Payer: Self-pay | Admitting: Physician Assistant

## 2016-03-19 NOTE — Telephone Encounter (Signed)
Rx sent to the pharmacy by e-script.//AB/CMA 

## 2016-04-10 ENCOUNTER — Telehealth: Payer: Self-pay

## 2016-04-10 MED ORDER — ICOSAPENT ETHYL 1 G PO CAPS: 2.0000 | ORAL_CAPSULE | Freq: Two times a day (BID) | ORAL | Status: DC

## 2016-04-10 NOTE — Telephone Encounter (Signed)
Left pt a message that I could send in a 1 month supply of his medication and that he is due for a follow up with PCP Jacob Morales. Pt was asked to call back and set up the follow up appointment within 1 month.  Rx has been sent to pharmacy.

## 2016-04-14 NOTE — Telephone Encounter (Signed)
Letter mailed to pt to call and set up a follow up appointment.

## 2016-06-19 ENCOUNTER — Other Ambulatory Visit: Payer: Self-pay | Admitting: Physician Assistant

## 2016-06-19 NOTE — Telephone Encounter (Signed)
Vascepa request denied as 30 day supply already sent in for pt in 03/2016 and letter was mailed regarding need for follow up for further refills.

## 2016-06-24 ENCOUNTER — Encounter: Payer: Self-pay | Admitting: Neurology

## 2016-09-26 ENCOUNTER — Ambulatory Visit: Payer: Managed Care, Other (non HMO) | Admitting: Neurology

## 2016-10-06 ENCOUNTER — Encounter: Payer: Self-pay | Admitting: Neurology

## 2016-10-06 ENCOUNTER — Ambulatory Visit (INDEPENDENT_AMBULATORY_CARE_PROVIDER_SITE_OTHER): Payer: Managed Care, Other (non HMO) | Admitting: Neurology

## 2016-10-06 VITALS — BP 126/82 | HR 94 | Ht 69.0 in | Wt 200.4 lb

## 2016-10-06 DIAGNOSIS — G40B09 Juvenile myoclonic epilepsy, not intractable, without status epilepticus: Secondary | ICD-10-CM

## 2016-10-06 MED ORDER — PHENYTOIN SODIUM EXTENDED 100 MG PO CAPS: ORAL_CAPSULE | ORAL | 3 refills | Status: DC

## 2016-10-06 NOTE — Patient Instructions (Signed)
1. Continue Dilantin 500mg  daily 2. Continue daily vitamin D 3. Follow-up in 1 year, call for any changes  Seizure Precautions: 1. If medication has been prescribed for you to prevent seizures, take it exactly as directed.  Do not stop taking the medicine without talking to your doctor first, even if you have not had a seizure in a long time.   2. Avoid activities in which a seizure would cause danger to yourself or to others.  Don't operate dangerous machinery, swim alone, or climb in high or dangerous places, such as on ladders, roofs, or girders.  Do not drive unless your doctor says you may.  3. If you have any warning that you may have a seizure, lay down in a safe place where you can't hurt yourself.    4.  No driving for 6 months from last seizure, as per [facility].   Please refer to the following link on the [place] website for more information: http://[email or web address].org/[email or web address]/driving/drivingu.cfm   5.  Maintain good sleep hygiene. Avoid alcohol.  6.  Contact your doctor if you have any problems that may be related to the medicine you are taking.  7.  Call 911 and bring the patient back to the ED if:        A.  The seizure lasts longer than 5 minutes.       B.  The patient doesn't awaken shortly after the seizure  C.  The patient has new problems such as difficulty seeing, speaking or moving  D.  The patient was injured during the seizure  E.  The patient has a temperature over 102 F (39C)  F.  The patient vomited and now is having trouble breathing

## 2016-10-06 NOTE — Progress Notes (Signed)
NEUROLOGY FOLLOW UP OFFICE NOTE  Jacob Morales WE:[id number]  HISTORY OF PRESENT ILLNESS: I had the pleasure of seeing Jacob Morales in follow-up in the neurology clinic on 10/06/2016. 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 He continues to do well with no seizures on Dilantin 500mg  qhs since age 55.  He denies any body jerks or convulsions, no staring/unresponsive episodes, gaps in time, focal numbness/tingling/weakness, olfactory/gustatory hallucinations. He is taking the vitamin D when he remembers. He denies any headaches, dizziness, diplopia, no falls. Memory is good. He is interested in starting a new medication for psoriasis and wonders about interaction with Dilantin. He is driving.  Seizure History: This is a very pleasant 55 yo RH man with a history of seizures since age 32. He reports hitting the back of his head at age 42, there was no loss of consciousness at that time, then a few months later he had his first seizure described as twitching of the arms. This did not progress to a generalized tonic-clonic seizure. A few months later, he had 2 more episodes of body twitching, one time he fell due to leg twitching. He was diagnosed with seizures at that time and started on Dilantin. His first generalized tonic-clonic seizure occurred at age 44, in the setting of stopping his medication. His mother had described a blank stare and then a generalized convulsion. He has had 3 GTCs in his lifetime, provoked by sleep deprivation and missing medications. The last GTC was at age 24. He reports that if he would miss Dilantin for a week, he would feel the twitching coming on. The last time this happened was several years ago. He denies any staring or unresponsive episodes, gaps in time, olfactory or gustatory hallucinations, focal numbness, tingling, or weakness. He has been taking Dilantin 500 mg at bedtime for many years.  He currently works as an Scientist, research (medical).   Epilepsy Risk Factors: He had a normal birth and early development. There is no history of febrile convulsions, CNS infections such as meningitis/encephalitis, significant traumatic brain injury, neurosurgical procedures, or family history of seizures.   Prior AEDs: None except Dilantin  Diagnostic Data: Last EEG and MRI were more than 10 years ago in [place], he vaguely recalls her the EEG as being abnormal.   PAST MEDICAL HISTORY: Past Medical History:  Diagnosis Date  . Psoriasis   . Seizure disorder [facility])     MEDICATIONS:  Outpatient Encounter Prescriptions as of 10/06/2016  Medication Sig  . calcipotriene-betamethasone (TACLONEX) external suspension Apply One Application Topically Daily  . calcipotriene-betamethasone (TACLONEX) ointment Apply 1 application topically daily.  . Cholecalciferol (VITAMIN D3) 3000 units TABS Take 1 tablet by mouth daily.  Vanessa Kick Ethyl (VASCEPA) 1 g CAPS Take 2 capsules by mouth 2 (two) times daily.  . phenytoin (DILANTIN) 100 MG ER capsule Take 5 capsules by mouth at bedtime  . erythromycin ophthalmic ointment Place 1 application into the right eye 3 (three) times daily. For 7-10 days (Patient not taking: Reported on 10/06/2016)   No facility-administered encounter medications on file as of 10/06/2016.      ALLERGIES: Allergies  Allergen Reactions  . Tricor [Fenofibrate] Rash    FAMILY HISTORY: Family History  Problem Relation Age of Onset  . Alcohol abuse Father   . Cancer Maternal Grandfather     SOCIAL HISTORY: Social History   Social History  . Marital status: Single    Spouse name: N/A  .  Number of children: N/A  . Years of education: N/A   Occupational History  . Not on file.   Social History Main Topics  . Smoking status: Never Smoker  . Smokeless tobacco: Never Used  . Alcohol use 0.0 oz/week     Comment: occasional  . Drug use: No  . Sexual activity: Yes    Birth control/ protection: Condom   Other  Topics Concern  . Not on file   Social History Narrative  . No narrative on file    REVIEW OF SYSTEMS: Constitutional: No fevers, chills, or sweats, generalized fatigue, no change in appetite Eyes: No visual changes, double vision, eye pain Ear, nose and throat: No hearing loss, ear pain, nasal congestion, sore throat Cardiovascular: No chest pain, palpitations Respiratory:  No shortness of breath at rest or with exertion, wheezes GastrointestinaI: No nausea, vomiting, diarrhea, abdominal pain, fecal incontinence Genitourinary:  No dysuria, urinary retention or frequency Musculoskeletal:  No neck pain, back pain Integumentary: No rash, pruritus, skin lesions Neurological: as above Psychiatric: No depression, insomnia, anxiety Endocrine: No palpitations, fatigue, diaphoresis, mood swings, change in appetite, change in weight, increased thirst Hematologic/Lymphatic:  No anemia, purpura, petechiae. Allergic/Immunologic: no itchy/runny eyes, nasal congestion, recent allergic reactions, rashes  PHYSICAL EXAM: Vitals:   10/06/16 1534  BP: 126/82  Pulse: 94   General: No acute distress Head:  Normocephalic/atraumatic Neck: supple, no paraspinal tenderness, full range of motion Heart:  Regular rate and rhythm Lungs:  Clear to auscultation bilaterally Back: No paraspinal tenderness Skin/Extremities: No rash, no edema Neurological Exam: alert and oriented to person, place, and time. No aphasia or dysarthria. Fund of knowledge is appropriate.  Recent and remote memory are intact. 3/3 delayed recall.  Attention and concentration are normal.    Able to name objects and repeat phrases. Cranial nerves: Pupils equal, round, reactive to light. Extraocular movements intact with no nystagmus. Visual fields full. Facial sensation intact. No facial asymmetry. Tongue, uvula, palate midline.  Motor: Bulk and tone normal, muscle strength 5/5 throughout with no pronator drift.  Sensation to light touch  intact.  No extinction to double simultaneous stimulation.  Deep tendon reflexes 2+ throughout, toes downgoing.  Finger to nose testing intact.  Gait narrow-based and steady, able to tandem walk adequately.  Romberg negative.  IMPRESSION: This is a very pleasant 55 yo RH man with a history of seizures since age 89. By description, seizures are suggestive of a primary generalized epilepsy with myoclonic jerks and generalized tonic-clonic seizures. He denies any history of absence seizures. He has been well controlled on Dilantin 500 mg at bedtime for several years. Last GTC was almost 20 years ago (age 27). We had previously discussed that although Dilantin is indicated for partial epilepsy, and he may benefit from more broad-spectrum antiepileptic medications if he were having side effects of the Dilantin, since seizures have been well controlled on the medication and he is not having any side effects, we have agreed to continue Dilantin at this time. Continue daily vitamin D supplement while on Dilantin. We have found no interaction between Dilantin and Cosentyx. We again discussed avoidance of seizure triggers, including missing medications, alcohol, and sleep deprivation were discussed. [place] driving laws were again discussed with the patient, and he knows to stop driving after a seizure, until 6 months seizure-free. He will follow-up annually and knows to call our office for any problems in the interim.  Thank you for allowing me to participate in his care.  Please do  not hesitate to call for any questions or concerns.  The duration of this appointment visit was 25 minutes of face-to-face time with the patient.  Greater than 50% of this time was spent in counseling, explanation of diagnosis, planning of further management, and coordination of care.   Ellouise Newer, M.D.   CC: Raiford Noble, PA-C

## 2017-01-12 ENCOUNTER — Other Ambulatory Visit: Payer: Self-pay | Admitting: Physician Assistant

## 2017-01-12 DIAGNOSIS — L409 Psoriasis, unspecified: Secondary | ICD-10-CM

## 2017-01-26 ENCOUNTER — Other Ambulatory Visit: Payer: Self-pay | Admitting: Physician Assistant

## 2017-01-29 ENCOUNTER — Other Ambulatory Visit: Payer: Self-pay | Admitting: Physician Assistant

## 2017-02-04 ENCOUNTER — Telehealth: Payer: Self-pay | Admitting: Physician Assistant

## 2017-02-04 NOTE — Telephone Encounter (Signed)
Ok with me 

## 2017-02-04 NOTE — Telephone Encounter (Signed)
Patient would like to transfer from [place] to [place] due to location being far, please advise

## 2017-02-05 NOTE — Telephone Encounter (Signed)
Ok to switch but only after pt come in to see me. If does not come in to see me then don't make switch. So go ahead and offer pt appiontment to see me. Schedule 30 minutes appoitment.(Early am 8-10 or 1-3 pm)

## 2017-02-06 NOTE — Telephone Encounter (Signed)
Patient scheduled with Percell Miller for 02/09/2017

## 2017-02-09 ENCOUNTER — Encounter: Payer: Self-pay | Admitting: Medical

## 2017-02-09 ENCOUNTER — Telehealth: Payer: Self-pay | Admitting: Medical

## 2017-02-09 ENCOUNTER — Ambulatory Visit (INDEPENDENT_AMBULATORY_CARE_PROVIDER_SITE_OTHER): Payer: Commercial Managed Care - PPO | Admitting: Medical

## 2017-02-09 VITALS — BP 113/81 | HR 73 | Temp 98.1°F | Resp 16 | Ht 69.0 in | Wt 204.4 lb

## 2017-02-09 DIAGNOSIS — J301 Allergic rhinitis due to pollen: Secondary | ICD-10-CM

## 2017-02-09 DIAGNOSIS — L409 Psoriasis, unspecified: Secondary | ICD-10-CM

## 2017-02-09 DIAGNOSIS — W57XXXA Bitten or stung by nonvenomous insect and other nonvenomous arthropods, initial encounter: Secondary | ICD-10-CM | POA: Diagnosis not present

## 2017-02-09 MED ORDER — FLUTICASONE PROPIONATE 50 MCG/ACT NA SUSP: 2.0000 | Freq: Every day | NASAL | 1 refills | Status: DC

## 2017-02-09 MED ORDER — CALCIPOTRIENE-BETAMETH DIPROP 0.005-0.064 % EX SUSP: CUTANEOUS | 3 refills | Status: DC

## 2017-02-09 MED ORDER — DOXYCYCLINE HYCLATE 100 MG PO TABS: 100.0000 mg | ORAL_TABLET | Freq: Two times a day (BID) | ORAL | 0 refills | Status: DC

## 2017-02-09 MED ORDER — CALCIPOTRIENE-BETAMETH DIPROP 0.005-0.064 % EX OINT: 1.0000 "application " | TOPICAL_OINTMENT | Freq: Every day | CUTANEOUS | 3 refills | Status: DC

## 2017-02-09 NOTE — Telephone Encounter (Signed)
Opened in error

## 2017-02-09 NOTE — Progress Notes (Signed)
Pre visit review using our clinic review tool, if applicable. No additional management support is needed unless otherwise documented below in the visit note. 

## 2017-02-09 NOTE — Progress Notes (Signed)
Subjective:    Patient ID: Jacob Morales, male    DOB: 27-Aug-1961, 56 y.o.   MRN: [id number]  HPI  Pt works at [employer], high stress work, admits very poor diet, rare alcohol use, 1 cup coffe on weekends, divorced- 2 children.  Pt in with nasal congestion, frontal sinus pressure and tired since this am.  On and off sneezing in morning. No runny nose.  Pt does have allergies this time of year in spring.(pt lived in [place] past 4 years)  Pt takes claritin otc. Does not use nasal sprays on regular basis but willing to try.  Pt states usually his allergies last about a month.  Pt had some high cholesterol in the past. He used to work out. He has history of triglycerides elevated. Allergic reaction to tricor. But with exercise he states tryglycerides came down gradually for a brief time.  Pt felt something on his rt popliteal area on 4th. Then April 5th he pulled out the tick. Still has small bump. No fever, no chills, no sweats, myalgia,  Pt has hx of psoriasis in past. None seen for 2 years.   Review of Systems  Constitutional: Negative for chills, fatigue and fever.  HENT: Positive for congestion and sinus pressure. Negative for ear discharge, ear pain, postnasal drip, rhinorrhea, sinus pain, sore throat and tinnitus.   Eyes: Negative for pain, redness and itching.  Respiratory: Positive for cough. Negative for chest tightness, shortness of breath and wheezing.   Cardiovascular: Negative for chest pain and palpitations.  Gastrointestinal: Negative for abdominal pain and anal bleeding.  Musculoskeletal: Negative for back pain.  Skin:       Psoriasis rash/plaques and small raised area where tick bite occurred rt popliteal fossa.  Neurological: Negative for dizziness, light-headedness, numbness and headaches.  Hematological: Negative for adenopathy. Does not bruise/bleed easily.  Psychiatric/Behavioral: Negative for behavioral problems and confusion.    Past Medical History:    Diagnosis Date  . Psoriasis   . Seizure disorder [facility])      Social History   Social History  . Marital status: Single    Spouse name: N/A  . Number of children: N/A  . Years of education: N/A   Occupational History  . Not on file.   Social History Main Topics  . Smoking status: Never Smoker  . Smokeless tobacco: Never Used  . Alcohol use 0.0 oz/week     Comment: occasional  . Drug use: No  . Sexual activity: Yes    Birth control/ protection: Condom   Other Topics Concern  . Not on file   Social History Narrative  . No narrative on file    No past surgical history on file.  Family History  Problem Relation Age of Onset  . Alcohol abuse Father   . Cancer Maternal Grandfather     Allergies  Allergen Reactions  . Tricor [Fenofibrate] Rash    Current Outpatient Prescriptions on File Prior to Visit  Medication Sig Dispense Refill  . calcipotriene-betamethasone (TACLONEX) external suspension Apply One Application Topically Daily 60 g 0  . calcipotriene-betamethasone (TACLONEX) ointment Apply 1 application topically daily. 60 g 3  . Cholecalciferol (VITAMIN D3) 3000 units TABS Take 1 tablet by mouth daily.    Marland Kitchen erythromycin ophthalmic ointment Place 1 application into the right eye 3 (three) times daily. For 7-10 days 3.5 g 0  . Icosapent Ethyl (VASCEPA) 1 g CAPS Take 2 capsules by mouth 2 (two) times daily. 120 capsule 0  .  phenytoin (DILANTIN) 100 MG ER capsule Take 5 capsules by mouth at bedtime 450 capsule 3   No current facility-administered medications on file prior to visit.     BP 113/81 (BP Location: Left Arm, Patient Position: Sitting, Cuff Size: Large)   Pulse 73   Temp 98.1 F (36.7 C) (Oral)   Resp 16   Ht 5\' 9"  (1.753 m)   Wt 204 lb 6.4 oz (92.7 kg)   SpO2 97%   BMI 30.18 kg/m       Objective:   Physical Exam  General  Mental Status - Alert. General Appearance - Well groomed. Not in acute distress.  Skin Rashes- No  Rashes.  HEENT Head- Normal. Ear Auditory Canal - Left- Normal. Right - Normal.Tympanic Membrane- Left- Normal. Right- Normal. Eye Sclera/Conjunctiva- Left- Normal. Right- Normal.(lower lids mild puffy) Nose & Sinuses Nasal Mucosa- Left-  Boggy and Congested. Right-  Boggy and  Congested.Bilateral no  maxillary and  No frontal sinus pressure. Mouth & Throat Lips: Upper Lip- Normal: no dryness, cracking, pallor, cyanosis, or vesicular eruption. Lower Lip-Normal: no dryness, cracking, pallor, cyanosis or vesicular eruption. Buccal Mucosa- Bilateral- No Aphthous ulcers. Oropharynx- No Discharge or Erythema. +pnd Tonsils: Characteristics- Bilateral- No Erythema or Congestion. Size/Enlargement- Bilateral- No enlargement. Discharge- bilateral-None.  Neck Neck- Supple. No Masses.   Chest and Lung Exam Auscultation: Breath Sounds:-Clear even and unlabored.  Cardiovascular Auscultation:Rythm- Regular, rate and rhythm. Murmurs & Other Heart Sounds:Ausculatation of the heart reveal- No Murmurs.  Lymphatic Head & Neck General Head & Neck Lymphatics: Bilateral: Description- No Localized lymphadenopathy.   Skin-scatterd circular psoriasis type plaques varying in size. Rt popliteal small raised 8 mm area where tick was attached. Mild indurate but not tender.      Assessment & Plan:  For recent allergic rhinitis continue claritin and will add flonase nasal spray. If your symptoms worsens such as sinus infection symptoms then start antibiotic  For your psoriasis continue talconex. If you want referral to dermatologist let us know.  For tick bite please do tick bite studies.   Will go ahead and rx doxycycline(making it available). Can use for sinus infection, skin infection or if tick bite studies positive.  Follow up in 10-14 days or as needed  Recommend scheduling CPE.  Kaytee Taliercio, Percell Miller, PA-C

## 2017-02-09 NOTE — Patient Instructions (Addendum)
For recent allergic rhinitis continue claritin and will add flonase nasal spray. If your symptoms worsens such as sinus infection symptoms then start antibiotic.  For your psoriasis continue talconex. If you want referral to dermatologist let us know.  For tick bite please do tick bite studies.   Will go ahead and rx doxycycline(making it available). Can use for sinus infection, skin infection or if tick bite studies positive.  Follow up in 10-14 days or as needed  Recommend scheduling CPE.

## 2017-02-10 LAB — LYME AB/WESTERN BLOT REFLEX: B burgdorferi Ab IgG+IgM: <0.9 Index (ref <0.90)

## 2017-02-11 LAB — LYME ABY, WSTRN BLT IGG & IGM W/BANDS
B burgdorferi IgG Abs (IB): NEGATIVE
B burgdorferi IgM Abs (IB): NEGATIVE
LYME DISEASE 18 KD IGG: NONREACTIVE
LYME DISEASE 23 KD IGG: NONREACTIVE
LYME DISEASE 28 KD IGG: NONREACTIVE
LYME DISEASE 30 KD IGG: NONREACTIVE
LYME DISEASE 41 KD IGM: NONREACTIVE
Lyme Disease 23 kD IgM: NONREACTIVE
Lyme Disease 39 kD IgG: NONREACTIVE
Lyme Disease 39 kD IgM: NONREACTIVE
Lyme Disease 41 kD IgG: NONREACTIVE
Lyme Disease 45 kD IgG: NONREACTIVE
Lyme Disease 58 kD IgG: NONREACTIVE
Lyme Disease 66 kD IgG: NONREACTIVE
Lyme Disease 93 kD IgG: NONREACTIVE

## 2017-02-13 ENCOUNTER — Encounter: Payer: Self-pay | Admitting: Emergency Medicine

## 2017-02-14 LAB — ROCKY MTN SPOTTED FVR ABS PNL(IGG+IGM)
RMSF IGG: DETECTED — AB
RMSF IGM: NOT DETECTED

## 2017-02-14 LAB — REFLEX RMSF IGG TITER: RMSF IgG Titer: 1:64 {titer} — ABNORMAL HIGH

## 2017-02-15 ENCOUNTER — Telehealth: Payer: Self-pay | Admitting: Medical

## 2017-02-15 MED ORDER — DOXYCYCLINE HYCLATE 100 MG PO TABS: 100.0000 mg | ORAL_TABLET | Freq: Two times a day (BID) | ORAL | 0 refills | Status: DC

## 2017-02-15 NOTE — Telephone Encounter (Signed)
Another 4 days of doxycycline sent in to pt pharmacy.

## 2017-02-16 NOTE — Telephone Encounter (Signed)
Notified pt that rx was sent to pharmacy.  

## 2017-02-20 ENCOUNTER — Ambulatory Visit: Payer: Commercial Managed Care - PPO | Admitting: Medical

## 2017-03-06 ENCOUNTER — Ambulatory Visit: Payer: Commercial Managed Care - PPO | Admitting: Medical

## 2017-03-06 ENCOUNTER — Telehealth: Payer: Self-pay | Admitting: Medical

## 2017-03-06 NOTE — Telephone Encounter (Signed)
Patient lvm at 6:45am cancelling 2:15pm appointment for today will reach out to patient to [facility], charge or no charge

## 2017-03-06 NOTE — Telephone Encounter (Signed)
Pt called back in to reschedule appt. appt has been scheduled for Wednesday 3:30p

## 2017-03-06 NOTE — Telephone Encounter (Signed)
No charge. 

## 2017-03-11 ENCOUNTER — Ambulatory Visit (INDEPENDENT_AMBULATORY_CARE_PROVIDER_SITE_OTHER): Payer: Commercial Managed Care - PPO | Admitting: Medical

## 2017-03-11 ENCOUNTER — Encounter: Payer: Self-pay | Admitting: Medical

## 2017-03-11 VITALS — BP 125/84 | HR 65 | Temp 97.7°F | Resp 16 | Ht 69.0 in | Wt 200.6 lb

## 2017-03-11 DIAGNOSIS — J301 Allergic rhinitis due to pollen: Secondary | ICD-10-CM | POA: Diagnosis not present

## 2017-03-11 DIAGNOSIS — L409 Psoriasis, unspecified: Secondary | ICD-10-CM

## 2017-03-11 NOTE — Patient Instructions (Addendum)
Your allergies are improved. Continue meds claritin and flonase until early June.  For psoriasis continue current treatment.  When you get colonoscopy report from GI office in [place] let us know and will refer to GI for repeat colonsocopy.  Follow up sept or October for cpe and flu vaccine.

## 2017-03-11 NOTE — Progress Notes (Signed)
Subjective:    Patient ID: Jacob Morales, male    DOB: Oct 12, 1961, 56 y.o.   MRN: [id number]  HPI  Pt in for follow up. He states his allergies improved a lot with claritin and flonase. Symptoms improved after one week.   Skin psoriasis is well controlled.   Pt had hx of tick bite but his studies came back negative. No tick bit symptoms developed.   Pt is eating less overall. He stops eating after 8 pm. Eating more fruits and vegetables. 4 pound weight loss with diet.  He feels better overall.     Review of Systems  Constitutional: Negative for chills, fatigue and fever.  HENT: Negative for congestion, ear pain, postnasal drip, sinus pain and sinus pressure.   Respiratory: Negative for cough, chest tightness, shortness of breath and wheezing.   Cardiovascular: Negative for chest pain and palpitations.  Gastrointestinal: Negative for abdominal pain and blood in stool.  Musculoskeletal: Negative for back pain, gait problem, neck pain and neck stiffness.  Skin: Negative for rash.       Skin/psorias stable.  Neurological: Negative for dizziness, speech difficulty, weakness, numbness and headaches.  Hematological: Negative for adenopathy. Does not bruise/bleed easily.  Psychiatric/Behavioral: Negative for behavioral problems, confusion and self-injury. The patient is not nervous/anxious.    Past Medical History:  Diagnosis Date  . Psoriasis   . Seizure disorder [facility])      Social History   Social History  . Marital status: Single    Spouse name: N/A  . Number of children: N/A  . Years of education: N/A   Occupational History  . Not on file.   Social History Main Topics  . Smoking status: Never Smoker  . Smokeless tobacco: Never Used  . Alcohol use 0.0 oz/week     Comment: occasional  . Drug use: No  . Sexual activity: Yes    Birth control/ protection: Condom   Other Topics Concern  . Not on file   Social History Narrative  . No narrative on file    No  past surgical history on file.  Family History  Problem Relation Age of Onset  . Alcohol abuse Father   . Cancer Maternal Grandfather     Allergies  Allergen Reactions  . Tricor [Fenofibrate] Rash    Current Outpatient Prescriptions on File Prior to Visit  Medication Sig Dispense Refill  . calcipotriene-betamethasone (TACLONEX) external suspension Apply One Application Topically Daily 60 g 3  . calcipotriene-betamethasone (TACLONEX) ointment Apply 1 application topically daily. 60 g 3  . Cholecalciferol (VITAMIN D3) 3000 units TABS Take 1 tablet by mouth daily.    Marland Kitchen doxycycline (VIBRA-TABS) 100 MG tablet Take 1 tablet (100 mg total) by mouth 2 (two) times daily. Can give caps or generic 8 tablet 0  . erythromycin ophthalmic ointment Place 1 application into the right eye 3 (three) times daily. For 7-10 days 3.5 g 0  . fluticasone (FLONASE) 50 MCG/ACT nasal spray Place 2 sprays into both nostrils daily. 16 g 1  . Icosapent Ethyl (VASCEPA) 1 g CAPS Take 2 capsules by mouth 2 (two) times daily. 120 capsule 0  . phenytoin (DILANTIN) 100 MG ER capsule Take 5 capsules by mouth at bedtime 450 capsule 3   No current facility-administered medications on file prior to visit.     BP 125/84 (BP Location: Right Arm, Patient Position: Sitting, Cuff Size: Normal)   Pulse 65   Temp 97.7 F (36.5 C) (Oral)   Resp 16  Ht 5\' 9"  (1.753 m)   Wt 200 lb 9.6 oz (91 kg)   SpO2 96%   BMI 29.62 kg/m       Objective:   Physical Exam  General  Mental Status - Alert. General Appearance - Well groomed. Not in acute distress.  Skin Rashes- faint stable psorias patches on forearms.  HEENT Head- Normal. Ear Auditory Canal - Left- Normal. Right - Normal.Tympanic Membrane- Left- Normal. Right- Normal. Eye Sclera/Conjunctiva- Left- Normal. Right- Normal. Nose & Sinuses Nasal Mucosa- Left-   Not Boggy and Congested. Right-  Not  Boggy and  Congested.Bilateral  No maxillary and no  frontal sinus  pressure. Mouth & Throat Lips: Upper Lip- Normal: no dryness, cracking, pallor, cyanosis, or vesicular eruption. Lower Lip-Normal: no dryness, cracking, pallor, cyanosis or vesicular eruption. Buccal Mucosa- Bilateral- No Aphthous ulcers. Oropharynx- No Discharge or Erythema. Tonsils: Characteristics- Bilateral- No Erythema or Congestion. Size/Enlargement- Bilateral- No enlargement. Discharge- bilateral-None.  Neck Neck- Supple. No Masses.   Chest and Lung Exam Auscultation: Breath Sounds:-Clear even and unlabored.  Cardiovascular Auscultation:Rythm- Regular, rate and rhythm. Murmurs & Other Heart Sounds:Ausculatation of the heart reveal- No Murmurs.  Lymphatic Head & Neck General Head & Neck Lymphatics: Bilateral: Description- No Localized lymphadenopathy.      Assessment & Plan:  Your allergies are improved. Continue meds claritin and flonase until early June.  For psoriasis continue current treatment.  When you get colonoscopy report from GI office in [place] let us know and will refer to GI for repeat colonsocopy.(pt want me to wait and refer after he gets report)  Follow up sept or October for cpe and flu vaccine.  Danija Gosa, Percell Miller, PA-C

## 2017-04-23 ENCOUNTER — Other Ambulatory Visit: Payer: Self-pay

## 2017-04-23 MED ORDER — FLUTICASONE PROPIONATE 50 MCG/ACT NA SUSP: 2.0000 | Freq: Every day | NASAL | 1 refills | Status: DC

## 2017-06-01 ENCOUNTER — Other Ambulatory Visit: Payer: Self-pay | Admitting: Medical

## 2017-09-23 ENCOUNTER — Other Ambulatory Visit: Payer: Self-pay

## 2017-09-23 DIAGNOSIS — G40B09 Juvenile myoclonic epilepsy, not intractable, without status epilepticus: Secondary | ICD-10-CM

## 2017-09-23 MED ORDER — PHENYTOIN SODIUM EXTENDED 100 MG PO CAPS: ORAL_CAPSULE | ORAL | 3 refills | Status: DC

## 2017-10-06 ENCOUNTER — Ambulatory Visit: Payer: Managed Care, Other (non HMO) | Admitting: Neurology

## 2017-10-14 ENCOUNTER — Encounter: Payer: Self-pay | Admitting: Neurology

## 2017-10-14 ENCOUNTER — Ambulatory Visit: Payer: Commercial Managed Care - PPO | Admitting: Neurology

## 2017-10-14 VITALS — BP 118/72 | HR 70 | Ht 68.0 in | Wt 203.0 lb

## 2017-10-14 DIAGNOSIS — G40B09 Juvenile myoclonic epilepsy, not intractable, without status epilepticus: Secondary | ICD-10-CM | POA: Diagnosis not present

## 2017-10-14 MED ORDER — PHENYTOIN SODIUM EXTENDED 100 MG PO CAPS: ORAL_CAPSULE | ORAL | 3 refills | Status: DC

## 2017-10-14 NOTE — Patient Instructions (Signed)
1. Continue Dilantin 500mg  daily 2. Follow-up in 1 year, call for any changes  Seizure Precautions: 1. If medication has been prescribed for you to prevent seizures, take it exactly as directed.  Do not stop taking the medicine without talking to your doctor first, even if you have not had a seizure in a long time.   2. Avoid activities in which a seizure would cause danger to yourself or to others.  Don't operate dangerous machinery, swim alone, or climb in high or dangerous places, such as on ladders, roofs, or girders.  Do not drive unless your doctor says you may.  3. If you have any warning that you may have a seizure, lay down in a safe place where you can't hurt yourself.    4.  No driving for 6 months from last seizure, as per [facility].   Please refer to the following link on the [place] website for more information: http://www.epilepsyfoundation.org/answerplace/Social/driving/drivingu.cfm   5.  Maintain good sleep hygiene. Avoid alcohol.  6.  Contact your doctor if you have any problems that may be related to the medicine you are taking.  7.  Call 911 and bring the patient back to the ED if:        A.  The seizure lasts longer than 5 minutes.       B.  The patient doesn't awaken shortly after the seizure  C.  The patient has new problems such as difficulty seeing, speaking or moving  D.  The patient was injured during the seizure  E.  The patient has a temperature over 102 F (39C)  F.  The patient vomited and now is having trouble breathing

## 2017-10-14 NOTE — Progress Notes (Signed)
NEUROLOGY FOLLOW UP OFFICE NOTE  Jacob Morales [id number]  HISTORY OF PRESENT ILLNESS: I had the pleasure of seeing Jacob Morales in follow-up in the neurology clinic on 10/14/2017. 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 He continues to do well with no seizures on Dilantin 500mg  qhs since age 56.  No side effects. He denies any body jerks or convulsions, no staring/unresponsive episodes, gaps in time, focal numbness/tingling/weakness, olfactory/gustatory hallucinations. He still has difficulties remembering to take vitamin D regularly. He denies any headaches, dizziness, diplopia, no falls. Memory is good.   Seizure History: This is a very pleasant 56 yo RH man with a history of seizures since age 28. He reports hitting the back of his head at age 54, there was no loss of consciousness at that time, then a few months later he had his first seizure described as twitching of the arms. This did not progress to a generalized tonic-clonic seizure. A few months later, he had 2 more episodes of body twitching, one time he fell due to leg twitching. He was diagnosed with seizures at that time and started on Dilantin. His first generalized tonic-clonic seizure occurred at age 22, in the setting of stopping his medication. His mother had described a blank stare and then a generalized convulsion. He has had 3 GTCs in his lifetime, provoked by sleep deprivation and missing medications. The last GTC was at age 42. He reports that if he would miss Dilantin for a week, he would feel the twitching coming on. The last time this happened was several years ago. He denies any staring or unresponsive episodes, gaps in time, olfactory or gustatory hallucinations, focal numbness, tingling, or weakness. He has been taking Dilantin 500 mg at bedtime for many years.  He currently works as an Insurance underwriter.   Epilepsy Risk Factors: He had a normal birth and early development.  There is no history of febrile convulsions, CNS infections such as meningitis/encephalitis, significant traumatic brain injury, neurosurgical procedures, or family history of seizures.   Prior AEDs: None except Dilantin  Diagnostic Data: Last EEG and MRI were more than 10 years ago in [place], he vaguely recalls her the EEG as being abnormal.   PAST MEDICAL HISTORY: Past Medical History:  Diagnosis Date  . Psoriasis   . Seizure disorder [facility])     MEDICATIONS:  Outpatient Encounter Medications as of 10/14/2017  Medication Sig  . calcipotriene-betamethasone (TACLONEX) external suspension Apply One Application Topically Daily  . calcipotriene-betamethasone (TACLONEX) ointment Apply 1 application topically daily.  . Cholecalciferol (VITAMIN D3) 3000 units TABS Take 1 tablet by mouth daily.  Marland Kitchen doxycycline (VIBRA-TABS) 100 MG tablet Take 1 tablet (100 mg total) by mouth 2 (two) times daily. Can give caps or generic  . erythromycin ophthalmic ointment Place 1 application into the right eye 3 (three) times daily. For 7-10 days  . fluticasone (FLONASE) 50 MCG/ACT nasal spray PLACE 2 SPRAYS INTO BOTH NOSTRILS DAILY.  Marland Kitchen Icosapent Ethyl (VASCEPA) 1 g CAPS Take 2 capsules by mouth 2 (two) times daily.  . phenytoin (DILANTIN) 100 MG ER capsule Take 5 capsules by mouth at bedtime   No facility-administered encounter medications on file as of 10/14/2017.      ALLERGIES: Allergies  Allergen Reactions  . Tricor [Fenofibrate] Rash    FAMILY HISTORY: Family History  Problem Relation Age of Onset  . Alcohol abuse Father   . Cancer Maternal Grandfather     SOCIAL HISTORY:  Social History   Socioeconomic History  . Marital status: Single    Spouse name: Not on file  . Number of children: Not on file  . Years of education: Not on file  . Highest education level: Not on file  Social Needs  . Financial resource strain: Not on file  . Food insecurity - worry: Not on file  . Food insecurity -  inability: Not on file  . Transportation needs - medical: Not on file  . Transportation needs - non-medical: Not on file  Occupational History  . Not on file  Tobacco Use  . Smoking status: Never Smoker  . Smokeless tobacco: Never Used  Substance and Sexual Activity  . Alcohol use: Yes    Alcohol/week: 0.0 oz    Comment: occasional  . Drug use: No  . Sexual activity: Yes    Birth control/protection: Condom  Other Topics Concern  . Not on file  Social History Narrative  . Not on file    REVIEW OF SYSTEMS: Constitutional: No fevers, chills, or sweats, generalized fatigue, no change in appetite Eyes: No visual changes, double vision, eye pain Ear, nose and throat: No hearing loss, ear pain, nasal congestion, sore throat Cardiovascular: No chest pain, palpitations Respiratory:  No shortness of breath at rest or with exertion, wheezes GastrointestinaI: No nausea, vomiting, diarrhea, abdominal pain, fecal incontinence Genitourinary:  No dysuria, urinary retention or frequency Musculoskeletal:  No neck pain, back pain Integumentary: No rash, pruritus, skin lesions Neurological: as above Psychiatric: No depression, insomnia, anxiety Endocrine: No palpitations, fatigue, diaphoresis, mood swings, change in appetite, change in weight, increased thirst Hematologic/Lymphatic:  No anemia, purpura, petechiae. Allergic/Immunologic: no itchy/runny eyes, nasal congestion, recent allergic reactions, rashes  PHYSICAL EXAM: Vitals:   10/14/17 1541  BP: 118/72  Pulse: 70  SpO2: 96%   General: No acute distress Head:  Normocephalic/atraumatic Neck: supple, no paraspinal tenderness, full range of motion Heart:  Regular rate and rhythm Lungs:  Clear to auscultation bilaterally Back: No paraspinal tenderness Skin/Extremities: No rash, no edema Neurological Exam: alert and oriented to person, place, and time. No aphasia or dysarthria. Fund of knowledge is appropriate.  Recent and remote  memory are intact. 3/3 delayed recall.  Attention and concentration are normal.    Able to name objects and repeat phrases. Cranial nerves: Pupils equal, round, reactive to light. Extraocular movements intact with no nystagmus. Visual fields full. Facial sensation intact. No facial asymmetry. Tongue, uvula, palate midline.  Motor: Bulk and tone normal, muscle strength 5/5 throughout with no pronator drift.  Sensation to light touch intact.  No extinction to double simultaneous stimulation.  Deep tendon reflexes 2+ throughout, toes downgoing.  Finger to nose testing intact.  Gait narrow-based and steady, able to tandem walk adequately.  Romberg negative.  IMPRESSION: This is a very pleasant 56 yo RH man with a history of seizures since age 89. By description, seizures are suggestive of a primary generalized epilepsy with myoclonic jerks and generalized tonic-clonic seizures. He denies any history of absence seizures. He has been well controlled on Dilantin 500 mg at bedtime for several years. Last GTC was almost 20 years ago (age 44). We had previously discussed that although Dilantin is indicated for partial epilepsy, and he may benefit from more broad-spectrum antiepileptic medications if he were having side effects of the Dilantin, since seizures have been well controlled on the medication and he is not having any side effects, we have agreed to continue Dilantin at this time. Refills  sent for another year. Continue daily vitamin D supplement while on Dilantin.He is aware of [place] driving laws to stop driving after a seizure, until 6 months seizure-free. He will follow-up annually and knows to call our office for any problems in the interim.  Thank you for allowing me to participate in his care.  Please do not hesitate to call for any questions or concerns.  The duration of this appointment visit was 15 minutes of face-to-face time with the patient.  Greater than 50% of this time was spent in counseling,  explanation of diagnosis, planning of further management, and coordination of care.   Ellouise Newer, M.D.   CC: Mackie Pai, PA-C

## 2018-01-26 ENCOUNTER — Telehealth: Payer: Self-pay | Admitting: Medical

## 2018-01-26 ENCOUNTER — Other Ambulatory Visit: Payer: Self-pay | Admitting: *Deleted

## 2018-01-26 DIAGNOSIS — L409 Psoriasis, unspecified: Secondary | ICD-10-CM

## 2018-01-26 MED ORDER — CALCIPOTRIENE-BETAMETH DIPROP 0.005-0.064 % EX OINT: 1.0000 "application " | TOPICAL_OINTMENT | Freq: Every day | CUTANEOUS | 0 refills | Status: DC

## 2018-01-26 NOTE — Telephone Encounter (Signed)
Copied from [place] [id number]. Topic: Quick Communication - Rx Refill/Question >> Jan 26, 2018  9:48 AM Boyd Kerbs wrote:  Medication: calcipotriene-betamethasone (TACLONEX) ointment   Has the patient contacted their pharmacy? Yes.   (Agent: If no, request that the patient contact the pharmacy for the refill.) Preferred Pharmacy (with phone number or street name):   [id number] - [place], [address] [place] [address] Phone: [phone] Fax: [phone]   Agent: Please be advised that RX refills may take up to 3 business days. We ask that you follow-up with your pharmacy.

## 2018-04-21 ENCOUNTER — Telehealth: Payer: Self-pay | Admitting: Medical

## 2018-04-21 DIAGNOSIS — L409 Psoriasis, unspecified: Secondary | ICD-10-CM

## 2018-04-21 NOTE — Telephone Encounter (Signed)
Copied from [place] [id number]. Topic: Quick Communication - See Telephone Encounter >> Apr 21, 2018  2:00 PM Vernona Rieger wrote: CRM for notification. See Telephone encounter for: 04/21/18.   [facility] and said the [facility] needs a refill on calcipotriene-betamethasone (TACLONEX) ointment

## 2018-04-22 MED ORDER — CALCIPOTRIENE-BETAMETH DIPROP 0.005-0.064 % EX OINT: 1.0000 "application " | TOPICAL_OINTMENT | Freq: Every day | CUTANEOUS | 0 refills | Status: DC

## 2018-04-22 NOTE — Telephone Encounter (Signed)
Patient called and advised he will need to schedule an appointment in order to receive medicaton refills, he agrees, appointment scheduled for a physical on Friday, 04/30/18 at 1340, patient verbalized understanding.

## 2018-04-30 ENCOUNTER — Encounter: Payer: Commercial Managed Care - PPO | Admitting: Medical

## 2018-05-05 ENCOUNTER — Encounter: Payer: Self-pay | Admitting: Medical

## 2018-05-05 ENCOUNTER — Ambulatory Visit (INDEPENDENT_AMBULATORY_CARE_PROVIDER_SITE_OTHER): Payer: Commercial Managed Care - PPO | Admitting: Medical

## 2018-05-05 ENCOUNTER — Telehealth: Payer: Self-pay | Admitting: Medical

## 2018-05-05 VITALS — BP 119/73 | HR 75 | Temp 98.1°F | Resp 16 | Ht 68.0 in | Wt 203.0 lb

## 2018-05-05 DIAGNOSIS — Z0000 Encounter for general adult medical examination without abnormal findings: Secondary | ICD-10-CM

## 2018-05-05 DIAGNOSIS — Z125 Encounter for screening for malignant neoplasm of prostate: Secondary | ICD-10-CM

## 2018-05-05 DIAGNOSIS — Z1211 Encounter for screening for malignant neoplasm of colon: Secondary | ICD-10-CM

## 2018-05-05 DIAGNOSIS — Z113 Encounter for screening for infections with a predominantly sexual mode of transmission: Secondary | ICD-10-CM

## 2018-05-05 DIAGNOSIS — L409 Psoriasis, unspecified: Secondary | ICD-10-CM

## 2018-05-05 NOTE — Telephone Encounter (Signed)
Urine sample on your desk. Saw in bathroom and never resulted. Will you replace order and result the urine.

## 2018-05-05 NOTE — Progress Notes (Signed)
Subjective:    Patient ID: Jacob Morales, male    DOB: 04-13-1961, 57 y.o.   MRN: [id number]  HPI  Pt in for CPE.  He has not eaten in 8 hours.  Works at [employer]. He has not been exercising. He is not aware of number of steps he gets a day. Rare coffee. Rare soda. Non smoker. Rare occasional. Moderate healthy diet.  Pt not sure when he had last colonoscopy. Likely due.  Review of Systems  Constitutional: Negative for chills, fatigue and fever.  Respiratory: Negative for cough, chest tightness, shortness of breath and wheezing.   Cardiovascular: Negative for chest pain and palpitations.  Gastrointestinal: Negative for abdominal pain.  Genitourinary: Negative for difficulty urinating, enuresis, flank pain, genital sores, hematuria, penile swelling, scrotal swelling and urgency.  Musculoskeletal: Negative for back pain and neck pain.       Pt notes some knee pain over past 3 months. But then month ago pain resoved about one month ago.  Skin: Positive for rash.       Years of psoriasis. Moderate to severe.  Neurological: Negative for dizziness, syncope, facial asymmetry, weakness and light-headedness.  Hematological: Negative for adenopathy. Does not bruise/bleed easily.  Psychiatric/Behavioral: Negative for behavioral problems, confusion and sleep disturbance. The patient is not nervous/anxious.     Past Medical History:  Diagnosis Date  . Psoriasis   . Seizure disorder [facility])      Social History   Socioeconomic History  . Marital status: Single    Spouse name: Not on file  . Number of children: Not on file  . Years of education: Not on file  . Highest education level: Not on file  Occupational History  . Not on file  Social Needs  . Financial resource strain: Not on file  . Food insecurity:    Worry: Not on file    Inability: Not on file  . Transportation needs:    Medical: Not on file    Non-medical: Not on file  Tobacco Use  . Smoking status: Never Smoker   . Smokeless tobacco: Never Used  Substance and Sexual Activity  . Alcohol use: Yes    Alcohol/week: 0.0 oz    Comment: occasional  . Drug use: No  . Sexual activity: Yes    Birth control/protection: Condom  Lifestyle  . Physical activity:    Days per week: Not on file    Minutes per session: Not on file  . Stress: Not on file  Relationships  . Social connections:    Talks on phone: Not on file    Gets together: Not on file    Attends religious service: Not on file    Active member of club or organization: Not on file    Attends meetings of clubs or organizations: Not on file    Relationship status: Not on file  . Intimate partner violence:    Fear of current or ex partner: Not on file    Emotionally abused: Not on file    Physically abused: Not on file    Forced sexual activity: Not on file  Other Topics Concern  . Not on file  Social History Narrative  . Not on file    No past surgical history on file.  Family History  Problem Relation Age of Onset  . Alcohol abuse Father   . Cancer Maternal Grandfather     Allergies  Allergen Reactions  . Tricor [Fenofibrate] Rash    Current Outpatient Medications  on File Prior to Visit  Medication Sig Dispense Refill  . calcipotriene-betamethasone (TACLONEX) external suspension Apply One Application Topically Daily 60 g 3  . calcipotriene-betamethasone (TACLONEX) ointment Apply 1 application topically daily. 60 g 0  . phenytoin (DILANTIN) 100 MG ER capsule Take 5 capsules by mouth at bedtime 450 capsule 3   No current facility-administered medications on file prior to visit.     BP 119/73   Pulse 75   Temp 98.1 F (36.7 C) (Oral)   Resp 16   Ht 5\' 8"  (1.727 m)   Wt 203 lb (92.1 kg)   SpO2 97%   BMI 30.87 kg/m       Objective:   Physical Exam   General Mental Status- Alert. General Appearance- Not in acute distress.   Skin General: Color- Normal Color. Moisture- Normal Moisture. Psoriasis type lesions on  anterior and posterior thorax.  Neck Carotid Arteries- Normal color. Moisture- Normal Moisture. No carotid bruits. No JVD.  Chest and Lung Exam Auscultation: Breath Sounds:-Normal.  Cardiovascular Auscultation:Rythm- Regular. Murmurs & Other Heart Sounds:Auscultation of the heart reveals- No Murmurs.  Abdomen Inspection:-Inspeection Normal. Palpation/Percussion:Note:No mass. Palpation and Percussion of the abdomen reveal- Non Tender, Non Distended + BS, no rebound or guarding.   Neurologic Cranial Nerve exam:- CN III-XII intact(No nystagmus), symmetric smile. Strength:- 5/5 equal and symmetric strength both upper and lower extremities.   HEENT Head- Normal. Ear Auditory Canal - Left- Normal. Right - Normal.Tympanic Membrane- Left- Normal. Right- Normal. Eye Sclera/Conjunctiva- Left- Normal. Right- Normal. Nose & Sinuses Nasal Mucosa- Left-  Not  Boggy and Congested. Right- not   Boggy and  Congested.Bilateral no  maxillary and no  frontal sinus pressure. Mouth & Throat Lips: Upper Lip- Normal: no dryness, cracking, pallor, cyanosis, or vesicular eruption. Lower Lip-Normal: no dryness, cracking, pallor, cyanosis or vesicular eruption. Buccal Mucosa- Bilateral- No Aphthous ulcers. Oropharynx- No Discharge or Erythema. Tonsils: Characteristics- Bilateral- No Erythema or Congestion. Size/Enlargement- Bilateral- No enlargement. Discharge- bilateral-None.    Rectum- smooth prostate, no nodules. Normal size. hemocult card negative. Genital exam- normal testices. No hernia.      Assessment & Plan:  For you wellness exam today I have ordered cbc, cmp, lipid panel,psa, ua and hiv.  Vaccine appear up to date.  Recommend exercise and healthy diet.  We will let you know lab results as they come in.  Follow up date appointment will be determined after lab review.   Will refer gi for colonoscopy.  Mackie Pai, PA-C

## 2018-05-05 NOTE — Patient Instructions (Addendum)
For you wellness exam today I have ordered cbc, cmp, lipid panel, psa ,ua and hiv.  Vaccine appear up to date.  Recommend exercise and healthy diet.  We will let you know lab results as they come in.  Follow up date appointment will be determined after lab review.   Will refer to derm for psoriasis.  Will refer gi for colonoscopy.   Preventive Care 40-64 Years, Male Preventive care refers to lifestyle choices and visits with your health care provider that can promote health and wellness. What does preventive care include?  A yearly physical exam. This is also called an annual well check.  Dental exams once or twice a year.  Routine eye exams. Ask your health care provider how often you should have your eyes checked.  Personal lifestyle choices, including: ? Daily care of your teeth and gums. ? Regular physical activity. ? Eating a healthy diet. ? Avoiding tobacco and drug use. ? Limiting alcohol use. ? Practicing safe sex. ? Taking low-dose aspirin every day starting at age 66. What happens during an annual well check? The services and screenings done by your health care provider during your annual well check will depend on your age, overall health, lifestyle risk factors, and family history of disease. Counseling Your health care provider may ask you questions about your:  Alcohol use.  Tobacco use.  Drug use.  Emotional well-being.  Home and relationship well-being.  Sexual activity.  Eating habits.  Work and work Statistician.  Screening You may have the following tests or measurements:  Height, weight, and BMI.  Blood pressure.  Lipid and cholesterol levels. These may be checked every 5 years, or more frequently if you are over 33 years old.  Skin check.  Lung cancer screening. You may have this screening every year starting at age 5 if you have a 30-pack-year history of smoking and currently smoke or have quit within the past 15 years.  Fecal  occult blood test (FOBT) of the stool. You may have this test every year starting at age 61.  Flexible sigmoidoscopy or colonoscopy. You may have a sigmoidoscopy every 5 years or a colonoscopy every 10 years starting at age 90.  Prostate cancer screening. Recommendations will vary depending on your family history and other risks.  Hepatitis C blood test.  Hepatitis B blood test.  Sexually transmitted disease (STD) testing.  Diabetes screening. This is done by checking your blood sugar (glucose) after you have not eaten for a while (fasting). You may have this done every 1-3 years.  Discuss your test results, treatment options, and if necessary, the need for more tests with your health care provider. Vaccines Your health care provider may recommend certain vaccines, such as:  Influenza vaccine. This is recommended every year.  Tetanus, diphtheria, and acellular pertussis (Tdap, Td) vaccine. You may need a Td booster every 10 years.  Varicella vaccine. You may need this if you have not been vaccinated.  Zoster vaccine. You may need this after age 45.  Measles, mumps, and rubella (MMR) vaccine. You may need at least one dose of MMR if you were born in 1957 or later. You may also need a second dose.  Pneumococcal 13-valent conjugate (PCV13) vaccine. You may need this if you have certain conditions and have not been vaccinated.  Pneumococcal polysaccharide (PPSV23) vaccine. You may need one or two doses if you smoke cigarettes or if you have certain conditions.  Meningococcal vaccine. You may need this if you have  certain conditions.  Hepatitis A vaccine. You may need this if you have certain conditions or if you travel or work in places where you may be exposed to hepatitis A.  Hepatitis B vaccine. You may need this if you have certain conditions or if you travel or work in places where you may be exposed to hepatitis B.  Haemophilus influenzae type b (Hib) vaccine. You may need  this if you have certain risk factors.  Talk to your health care provider about which screenings and vaccines you need and how often you need them. This information is not intended to replace advice given to you by your health care provider. Make sure you discuss any questions you have with your health care provider. Document Released: 11/09/2015 Document Revised: 07/02/2016 Document Reviewed: 08/14/2015 Elsevier Interactive Patient Education  [employer].

## 2018-05-06 LAB — LDL CHOLESTEROL, DIRECT: Direct LDL: 67 mg/dL

## 2018-05-06 LAB — CBC WITH DIFFERENTIAL/PLATELET
Basophils Absolute: 0 10*3/uL (ref 0.0–0.1)
Basophils Relative: 1.3 % (ref 0.0–3.0)
EOS ABS: 0.1 10*3/uL (ref 0.0–0.7)
Eosinophils Relative: 3.1 % (ref 0.0–5.0)
HEMATOCRIT: 42.4 % (ref 39.0–52.0)
HEMOGLOBIN: 14.4 g/dL (ref 13.0–17.0)
LYMPHS PCT: 40.4 % (ref 12.0–46.0)
Lymphs Abs: 1.5 10*3/uL (ref 0.7–4.0)
MCHC: 34 g/dL (ref 30.0–36.0)
MCV: 87.6 fl (ref 78.0–100.0)
MONO ABS: 0.3 10*3/uL (ref 0.1–1.0)
Monocytes Relative: 8.6 % (ref 3.0–12.0)
Neutro Abs: 1.7 10*3/uL (ref 1.4–7.7)
Neutrophils Relative %: 46.6 % (ref 43.0–77.0)
Platelets: 168 10*3/uL (ref 150.0–400.0)
RBC: 4.83 Mil/uL (ref 4.22–5.81)
RDW: 13.7 % (ref 11.5–15.5)
WBC: 3.7 10*3/uL — ABNORMAL LOW (ref 4.0–10.5)

## 2018-05-06 LAB — COMPREHENSIVE METABOLIC PANEL
ALBUMIN: 4.3 g/dL (ref 3.5–5.2)
ALK PHOS: 74 U/L (ref 39–117)
ALT: 26 U/L (ref 0–53)
AST: 21 U/L (ref 0–37)
BILIRUBIN TOTAL: 0.4 mg/dL (ref 0.2–1.2)
BUN: 15 mg/dL (ref 6–23)
CALCIUM: 9.5 mg/dL (ref 8.4–10.5)
CO2: 31 mEq/L (ref 19–32)
CREATININE: 0.84 mg/dL (ref 0.40–1.50)
Chloride: 101 mEq/L (ref 96–112)
GFR: 99.36 mL/min (ref >60.00)
Glucose, Bld: 89 mg/dL (ref 70–99)
Potassium: 4.3 mEq/L (ref 3.5–5.1)
Sodium: 138 mEq/L (ref 135–145)
TOTAL PROTEIN: 6.9 g/dL (ref 6.0–8.3)

## 2018-05-06 LAB — LIPID PANEL
CHOLESTEROL: 238 mg/dL — AB (ref 0–200)
HDL: 34.2 mg/dL — AB (ref >39.00)
Total CHOL/HDL Ratio: 7

## 2018-05-06 LAB — POC URINALSYSI DIPSTICK (AUTOMATED)
BILIRUBIN UA: NEGATIVE
GLUCOSE UA: NEGATIVE
KETONES UA: NEGATIVE
LEUKOCYTES UA: NEGATIVE
NITRITE UA: NEGATIVE
Protein, UA: NEGATIVE
Spec Grav, UA: >=1.03 — AB (ref 1.010–1.025)
Urobilinogen, UA: NEGATIVE E.U./dL — AB
pH, UA: 5.5 (ref 5.0–8.0)

## 2018-05-06 LAB — HIV ANTIBODY (ROUTINE TESTING W REFLEX): HIV 1&2 Ab, 4th Generation: NONREACTIVE

## 2018-05-06 LAB — PSA: PSA: 0.7 ng/mL (ref 0.10–4.00)

## 2018-05-06 NOTE — Addendum Note (Signed)
Addended by: Hinton Dyer on: 05/06/2018 08:38 AM   Modules accepted: Orders

## 2018-06-08 ENCOUNTER — Encounter: Payer: Self-pay | Admitting: Medical

## 2018-09-10 ENCOUNTER — Other Ambulatory Visit: Payer: Self-pay | Admitting: Medical

## 2018-09-10 DIAGNOSIS — L409 Psoriasis, unspecified: Secondary | ICD-10-CM

## 2018-09-14 ENCOUNTER — Ambulatory Visit (INDEPENDENT_AMBULATORY_CARE_PROVIDER_SITE_OTHER): Payer: Commercial Managed Care - PPO

## 2018-09-14 ENCOUNTER — Encounter: Payer: Self-pay | Admitting: Podiatry

## 2018-09-14 ENCOUNTER — Ambulatory Visit: Payer: Commercial Managed Care - PPO | Admitting: Podiatry

## 2018-09-14 VITALS — BP 141/90 | HR 84 | Resp 16

## 2018-09-14 DIAGNOSIS — M722 Plantar fascial fibromatosis: Secondary | ICD-10-CM | POA: Diagnosis not present

## 2018-09-14 MED ORDER — MELOXICAM 15 MG PO TABS: 15.0000 mg | ORAL_TABLET | Freq: Every day | ORAL | 3 refills | Status: DC

## 2018-09-14 MED ORDER — METHYLPREDNISOLONE 4 MG PO TBPK: ORAL_TABLET | ORAL | 0 refills | Status: DC

## 2018-09-14 NOTE — Patient Instructions (Signed)

## 2018-09-15 NOTE — Progress Notes (Signed)
  Subjective:  Patient ID: Jacob Morales, male    DOB: [birth year],  MRN: [id number] HPI Chief Complaint  Patient presents with  . Foot Pain    Plantar heel bilateral (R>L) - aching and sharp x 3 months, AM pain, standing and walking at work makes worse, tried stretching  . New Patient (Initial Visit)    57 y.o. male presents with the above complaint.  ROS:  He denies fever chills nausea vomiting muscle aches pains calf pain back pain chest pain shortness of breath.  Past Medical History:  Diagnosis Date  . Psoriasis   . Seizure disorder ([place])    No past surgical history on file.  Current Outpatient Medications:  .  calcipotriene-betamethasone (TACLONEX) ointment, APPLY TO AFFECTED AREA EVERY DAY, Disp: 60 g, Rfl: 0 .  meloxicam (MOBIC) 15 MG tablet, Take 1 tablet (15 mg total) by mouth daily., Disp: 30 tablet, Rfl: 3 .  methylPREDNISolone (MEDROL DOSEPAK) 4 MG TBPK tablet, 6 day dose pack - take as directed, Disp: 21 tablet, Rfl: 0 .  phenytoin (DILANTIN) 100 MG ER capsule, Take 5 capsules by mouth at bedtime, Disp: 450 capsule, Rfl: 3  Allergies  Allergen Reactions  . Tricor [Fenofibrate] Rash   Review of Systems Objective:   Vitals:   09/14/18 1536  BP: (!) 141/90  Pulse: 84  Resp: 16    General: Well developed, nourished, in no acute distress, alert and oriented x3   Dermatological: Skin is warm, dry and supple bilateral. Nails x 10 are well maintained; remaining integument appears unremarkable at this time. There are no open sores, no preulcerative lesions, no rash or signs of infection present.  Vascular: Dorsalis Pedis artery and Posterior Tibial artery pedal pulses are 2/4 bilateral with immedate capillary fill time. Pedal hair growth present. No varicosities and no lower extremity edema present bilateral.   Neruologic: Grossly intact via light touch bilateral. Vibratory intact via tuning fork bilateral. Protective threshold with Semmes Wienstein monofilament  intact to all pedal sites bilateral. Patellar and Achilles deep tendon reflexes 2+ bilateral. No Babinski or clonus noted bilateral.   Musculoskeletal: No gross boney pedal deformities bilateral. No pain, crepitus, or limitation noted with foot and ankle range of motion bilateral. Muscular strength 5/5 in all groups tested bilateral.  Pain on palpation medial calcaneal tubercle of the right heel versus the left.  Gait: Unassisted, Nonantalgic.    Radiographs:  Radiographs taken today demonstrate soft tissue increase in density plantar calcaneal insertion site right foot greater than out of the left with a plantar distally oriented calcaneal spur of the right heel.  Assessment & Plan:   Assessment: Plantar fasciitis bilateral right greater than left.  Plan: After sterile Betadine skin prep I injected 20 mg Kenalog 5 mg Marcaine plantar aspect of the bilateral heel dispensed plantar fascial braces and a night splint.  Discussed appropriate shoe gear stretching exercise ice therapy sugar modifications dispensed prescription for Medrol Dosepak to be followed by meloxicam.  Discussed appropriate shoe gear stretching exercise ice therapy and sugar modifications.     Daya Dutt T. [place], [place]

## 2018-09-16 ENCOUNTER — Telehealth: Payer: Self-pay | Admitting: Podiatry

## 2018-09-16 NOTE — Telephone Encounter (Signed)
Patient called with a question concerning his medication. Please give patient a call.

## 2018-09-16 NOTE — Telephone Encounter (Signed)
I called pt and he states he did not read the instructions and took all 6 pills at once and threw up. I asked pt how he felt and he stated he felt fine, no problems. I told him, that it was unfortunate that happened, but he should have no problem and he should begin on day 5 and take as instructed.

## 2018-09-20 NOTE — Telephone Encounter (Signed)
That's fine

## 2018-10-04 ENCOUNTER — Telehealth: Payer: Self-pay | Admitting: Podiatry

## 2018-10-04 NOTE — Telephone Encounter (Signed)
Left message for pt to call concerning the meloxicam.

## 2018-10-04 NOTE — Telephone Encounter (Signed)
Pt called with questions regarding Meloxicam. Please give pt a call back.

## 2018-10-05 NOTE — Telephone Encounter (Signed)
Pt called states he had developed an allergic reaction of bumps all over from the meloxicam. I told pt to stop the meloxicam, if able to take benadryl take as package directs for the rash and itching, and if he had difficulty breathing or swelling to face or mouth he would need to go to the ED.

## 2018-10-07 ENCOUNTER — Encounter: Payer: Self-pay | Admitting: Podiatry

## 2018-10-07 ENCOUNTER — Ambulatory Visit: Payer: Commercial Managed Care - PPO | Admitting: Podiatry

## 2018-10-07 DIAGNOSIS — M722 Plantar fascial fibromatosis: Secondary | ICD-10-CM

## 2018-10-07 MED ORDER — CELECOXIB 200 MG PO CAPS: 200.0000 mg | ORAL_CAPSULE | Freq: Two times a day (BID) | ORAL | 0 refills | Status: DC

## 2018-10-07 NOTE — Progress Notes (Signed)
He presents today for follow-up plantar fasciitis states that his nails are no longer hurting.  States that he did have a slight rash with the meloxicam.  Objective: Vital signs are stable he is alert and oriented x3 he has no pain on palpation medial calcaneal tubercle is bilateral pulses are palpable bilateral.  No open lesions or wounds.  Assessment: 100% resolution plantar fasciitis bilateral.  Plan: Started him on Celebrex 200 mg once a day to continue to take for the next month.  He will continue with his plantar fascial brace as well.

## 2018-10-14 ENCOUNTER — Ambulatory Visit: Payer: Commercial Managed Care - PPO | Admitting: Podiatry

## 2018-10-15 ENCOUNTER — Ambulatory Visit: Payer: Commercial Managed Care - PPO | Admitting: Neurology

## 2018-10-21 ENCOUNTER — Other Ambulatory Visit (INDEPENDENT_AMBULATORY_CARE_PROVIDER_SITE_OTHER): Payer: Commercial Managed Care - PPO

## 2018-10-21 ENCOUNTER — Encounter: Payer: Self-pay | Admitting: Neurology

## 2018-10-21 ENCOUNTER — Other Ambulatory Visit: Payer: Self-pay

## 2018-10-21 ENCOUNTER — Ambulatory Visit: Payer: Commercial Managed Care - PPO | Admitting: Neurology

## 2018-10-21 VITALS — BP 128/84 | HR 77 | Ht 68.0 in | Wt 203.0 lb

## 2018-10-21 DIAGNOSIS — G40B09 Juvenile myoclonic epilepsy, not intractable, without status epilepticus: Secondary | ICD-10-CM | POA: Diagnosis not present

## 2018-10-21 DIAGNOSIS — R6889 Other general symptoms and signs: Secondary | ICD-10-CM | POA: Diagnosis not present

## 2018-10-21 DIAGNOSIS — Z8639 Personal history of other endocrine, nutritional and metabolic disease: Secondary | ICD-10-CM | POA: Diagnosis not present

## 2018-10-21 LAB — VITAMIN D 25 HYDROXY (VIT D DEFICIENCY, FRACTURES): VITD: 18.31 ng/mL — AB (ref 30.00–100.00)

## 2018-10-21 MED ORDER — PHENYTOIN SODIUM EXTENDED 100 MG PO CAPS: ORAL_CAPSULE | ORAL | 3 refills | Status: DC

## 2018-10-21 NOTE — Progress Notes (Signed)
NEUROLOGY FOLLOW UP OFFICE NOTE  Jacob Morales [id number]  DOB: February 09, 1961  HISTORY OF PRESENT ILLNESS: I had the pleasure of seeing Jacob Morales in follow-up in the neurology clinic on 10/21/2018. 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 He denies any seizures since age 57, he is taking Dilantin 500mg  qhs without side effects. He denies any staring/unresponsive episodes, gaps in time, olfactory/gustatory hallucinations, focal numbness/tingling/weakness, myoclonic jerks. No headaches, dizziness, diplopia, no falls. He has had some ankle pain and was prescribed meloxicam which caused a rash on his wrists, arms, and trunk. He now has a prescription for Celebrex but has not started it yet. He states his memory is excellent, although he occasionally forgets to take his vitamin D supplement.   Seizure History: This is a very pleasant 57 yo RH man with a history of seizures since age 57. He reports hitting the back of his head at age 57, there was no loss of consciousness at that time, then a few months later he had his first seizure described as twitching of the arms. This did not progress to a generalized tonic-clonic seizure. A few months later, he had 2 more episodes of body twitching, one time he fell due to leg twitching. He was diagnosed with seizures at that time and started on Dilantin. His first generalized tonic-clonic seizure occurred at age 57, in the setting of stopping his medication. His mother had described a blank stare and then a generalized convulsion. He has had 3 GTCs in his lifetime, provoked by sleep deprivation and missing medications. The last GTC was at age 57. He reports that if he would miss Dilantin for a week, he would feel the twitching coming on. The last time this happened was several years ago. He denies any staring or unresponsive episodes, gaps in time, olfactory or gustatory hallucinations, focal numbness, tingling, or weakness. He has been  taking Dilantin 500 mg at bedtime for many years.  He currently works as an Insurance underwriter.   Epilepsy Risk Factors: He had a normal birth and early development. There is no history of febrile convulsions, CNS infections such as meningitis/encephalitis, significant traumatic brain injury, neurosurgical procedures, or family history of seizures.   Prior AEDs: None except Dilantin  Diagnostic Data: Last EEG and MRI were more than 10 years ago in [place], he vaguely recalls her the EEG as being abnormal.   PAST MEDICAL HISTORY: Past Medical History:  Diagnosis Date  . Psoriasis   . Seizure disorder [facility])     MEDICATIONS:  Outpatient Encounter Medications as of 10/21/2018  Medication Sig  . calcipotriene-betamethasone (TACLONEX) ointment APPLY TO AFFECTED AREA EVERY DAY  . phenytoin (DILANTIN) 100 MG ER capsule Take 5 capsules by mouth at bedtime  . [DISCONTINUED] meloxicam (MOBIC) 15 MG tablet Take 1 tablet (15 mg total) by mouth daily.  . celecoxib (CELEBREX) 200 MG capsule Take 1 capsule (200 mg total) by mouth 2 (two) times daily. (Patient not taking: Reported on 10/21/2018)   No facility-administered encounter medications on file as of 10/21/2018.      ALLERGIES: Allergies  Allergen Reactions  . Tricor [Fenofibrate] Rash    FAMILY HISTORY: Family History  Problem Relation Age of Onset  . Alcohol abuse Father   . Cancer Maternal Grandfather     SOCIAL HISTORY: Social History   Socioeconomic History  . Marital status: Single    Spouse name: Not on file  . Number of children: Not on  file  . Years of education: Not on file  . Highest education level: Not on file  Occupational History  . Not on file  Social Needs  . Financial resource strain: Not on file  . Food insecurity:    Worry: Not on file    Inability: Not on file  . Transportation needs:    Medical: Not on file    Non-medical: Not on file  Tobacco Use  . Smoking status: Never Smoker  .  Smokeless tobacco: Never Used  Substance and Sexual Activity  . Alcohol use: Yes    Alcohol/week: 0.0 standard drinks    Comment: occasional  . Drug use: No  . Sexual activity: Yes    Birth control/protection: Condom  Lifestyle  . Physical activity:    Days per week: Not on file    Minutes per session: Not on file  . Stress: Not on file  Relationships  . Social connections:    Talks on phone: Not on file    Gets together: Not on file    Attends religious service: Not on file    Active member of club or organization: Not on file    Attends meetings of clubs or organizations: Not on file    Relationship status: Not on file  . Intimate partner violence:    Fear of current or ex partner: Not on file    Emotionally abused: Not on file    Physically abused: Not on file    Forced sexual activity: Not on file  Other Topics Concern  . Not on file  Social History Narrative  . Not on file    REVIEW OF SYSTEMS: Constitutional: No fevers, chills, or sweats, generalized fatigue, no change in appetite Eyes: No visual changes, double vision, eye pain Ear, nose and throat: No hearing loss, ear pain, nasal congestion, sore throat Cardiovascular: No chest pain, palpitations Respiratory:  No shortness of breath at rest or with exertion, wheezes GastrointestinaI: No nausea, vomiting, diarrhea, abdominal pain, fecal incontinence Genitourinary:  No dysuria, urinary retention or frequency Musculoskeletal:  No neck pain, back pain Integumentary: +healing rashes over both wrists Neurological: as above Psychiatric: No depression, insomnia, anxiety Endocrine: No palpitations, fatigue, diaphoresis, mood swings, change in appetite, change in weight, increased thirst Hematologic/Lymphatic:  No anemia, purpura, petechiae. Allergic/Immunologic: no itchy/runny eyes, nasal congestion, recent allergic reactions, rashes  PHYSICAL EXAM: Vitals:   10/21/18 0947  BP: 128/84  Pulse: 77  SpO2: 96%    General: No acute distress Head:  Normocephalic/atraumatic Neck: supple, no paraspinal tenderness, full range of motion Heart:  Regular rate and rhythm Lungs:  Clear to auscultation bilaterally Back: No paraspinal tenderness Skin/Extremities: No rash, no edema Neurological Exam: alert and oriented to person, place, and time. No aphasia or dysarthria. Fund of knowledge is appropriate.  Recent and remote memory are intact. 3/3 delayed recall.  Attention and concentration are normal.    Able to name objects and repeat phrases. Cranial nerves: Pupils equal, round, reactive to light. Extraocular movements intact with no nystagmus. Visual fields full. Facial sensation intact. No facial asymmetry. Tongue, uvula, palate midline.  Motor: Bulk and tone normal, muscle strength 5/5 throughout with no pronator drift.  Sensation to light touch intact.  No extinction to double simultaneous stimulation.  Deep tendon reflexes +1 throughout, toes downgoing.  Finger to nose testing intact.  Gait narrow-based and steady, able to tandem walk adequately.  Romberg negative.  IMPRESSION: This is a very pleasant 57 yo RH man with  a history of seizures since age 61. By description, seizures are suggestive of a primary generalized epilepsy with myoclonic jerks and generalized tonic-clonic seizures. He denies any history of absence seizures. He continues to be well-controlled on Dilantin 500mg  qhs with no side effects. Last GTC was at age 60. Although Dilantin is typically indicated for partial epilepsy, since seizures have been well controlled on the medication and he is not having any side effects, he would like to continue on Dilantin. Continue to monitor bone health and vitamin D levels while on Dilantin. He is aware of [place] driving laws to stop driving after a seizure, until 6 months seizure-free. He will follow-up in 1 year and knows to call our office for any problems in the interim.  Thank you for allowing me to  participate in his care.  Please do not hesitate to call for any questions or concerns.  The duration of this appointment visit was 25 minutes of face-to-face time with the patient.  Greater than 50% of this time was spent in counseling, explanation of diagnosis, planning of further management, and coordination of care.   Ellouise Newer, M.D.   CC: Mackie Pai, PA-C

## 2018-10-21 NOTE — Patient Instructions (Addendum)
Great seeing you! 1. Continue Dilantin 100mg : take 5 capsules every night 2. Bloodwork for vitamin D level 3. Follow-up in 1 year, call for any changes  Seizure Precautions: 1. If medication has been prescribed for you to prevent seizures, take it exactly as directed.  Do not stop taking the medicine without talking to your doctor first, even if you have not had a seizure in a long time.   2. Avoid activities in which a seizure would cause danger to yourself or to others.  Don't operate dangerous machinery, swim alone, or climb in high or dangerous places, such as on ladders, roofs, or girders.  Do not drive unless your doctor says you may.  3. If you have any warning that you may have a seizure, lay down in a safe place where you can't hurt yourself.    4.  No driving for 6 months from last seizure, as per [facility].   Please refer to the following link on the [facility] website for more information: http://[email or web address].org/[email or web address]/drivingu.cfm   5.  Maintain good sleep hygiene. Avoid alcohol.  6.  Contact your doctor if you have any problems that may be related to the medicine you are taking.  7.  Call 911 and bring the patient back to the ED if:        A.  The seizure lasts longer than 5 minutes.       B.  The patient doesn't awaken shortly after the seizure  C.  The patient has new problems such as difficulty seeing, speaking  Your provider has requested that you have labwork completed today. Please go to [facility] ([id number]) on the second floor of this building before leaving the office today. You do not need to check in. If you are not called within 15 minutes please check with the front desk.       or moving  D.  The patient was injured during the seizure  E.  The patient has a temperature over 102 F (39C)  F.  The patient vomited and now is having trouble breathing

## 2018-10-25 ENCOUNTER — Other Ambulatory Visit: Payer: Self-pay

## 2018-10-25 MED ORDER — VITAMIN D3 1.25 MG (50000 UT) PO TABS: 50000.0000 [IU] | ORAL_TABLET | ORAL | 0 refills | Status: DC

## 2018-11-15 DIAGNOSIS — L400 Psoriasis vulgaris: Secondary | ICD-10-CM | POA: Diagnosis not present

## 2018-11-17 DIAGNOSIS — L400 Psoriasis vulgaris: Secondary | ICD-10-CM | POA: Diagnosis not present

## 2018-11-19 DIAGNOSIS — L400 Psoriasis vulgaris: Secondary | ICD-10-CM | POA: Diagnosis not present

## 2018-11-22 DIAGNOSIS — L400 Psoriasis vulgaris: Secondary | ICD-10-CM | POA: Diagnosis not present

## 2018-11-24 DIAGNOSIS — L400 Psoriasis vulgaris: Secondary | ICD-10-CM | POA: Diagnosis not present

## 2018-12-01 DIAGNOSIS — L400 Psoriasis vulgaris: Secondary | ICD-10-CM | POA: Diagnosis not present

## 2018-12-03 DIAGNOSIS — L400 Psoriasis vulgaris: Secondary | ICD-10-CM | POA: Diagnosis not present

## 2018-12-06 DIAGNOSIS — L400 Psoriasis vulgaris: Secondary | ICD-10-CM | POA: Diagnosis not present

## 2018-12-08 DIAGNOSIS — L400 Psoriasis vulgaris: Secondary | ICD-10-CM | POA: Diagnosis not present

## 2018-12-10 DIAGNOSIS — L400 Psoriasis vulgaris: Secondary | ICD-10-CM | POA: Diagnosis not present

## 2018-12-13 DIAGNOSIS — L400 Psoriasis vulgaris: Secondary | ICD-10-CM | POA: Diagnosis not present

## 2018-12-15 DIAGNOSIS — L400 Psoriasis vulgaris: Secondary | ICD-10-CM | POA: Diagnosis not present

## 2018-12-17 DIAGNOSIS — L400 Psoriasis vulgaris: Secondary | ICD-10-CM | POA: Diagnosis not present

## 2018-12-20 DIAGNOSIS — L400 Psoriasis vulgaris: Secondary | ICD-10-CM | POA: Diagnosis not present

## 2018-12-22 DIAGNOSIS — L400 Psoriasis vulgaris: Secondary | ICD-10-CM | POA: Diagnosis not present

## 2018-12-24 DIAGNOSIS — L400 Psoriasis vulgaris: Secondary | ICD-10-CM | POA: Diagnosis not present

## 2018-12-29 DIAGNOSIS — L400 Psoriasis vulgaris: Secondary | ICD-10-CM | POA: Diagnosis not present

## 2019-01-05 DIAGNOSIS — L400 Psoriasis vulgaris: Secondary | ICD-10-CM | POA: Diagnosis not present

## 2019-01-07 DIAGNOSIS — L400 Psoriasis vulgaris: Secondary | ICD-10-CM | POA: Diagnosis not present

## 2019-01-10 DIAGNOSIS — L400 Psoriasis vulgaris: Secondary | ICD-10-CM | POA: Diagnosis not present

## 2019-01-12 DIAGNOSIS — L400 Psoriasis vulgaris: Secondary | ICD-10-CM | POA: Diagnosis not present

## 2019-01-17 DIAGNOSIS — L400 Psoriasis vulgaris: Secondary | ICD-10-CM | POA: Diagnosis not present

## 2019-01-19 DIAGNOSIS — L400 Psoriasis vulgaris: Secondary | ICD-10-CM | POA: Diagnosis not present

## 2019-01-24 DIAGNOSIS — L400 Psoriasis vulgaris: Secondary | ICD-10-CM | POA: Diagnosis not present

## 2019-01-26 DIAGNOSIS — L400 Psoriasis vulgaris: Secondary | ICD-10-CM | POA: Diagnosis not present

## 2019-01-28 DIAGNOSIS — L400 Psoriasis vulgaris: Secondary | ICD-10-CM | POA: Diagnosis not present

## 2019-01-31 DIAGNOSIS — L400 Psoriasis vulgaris: Secondary | ICD-10-CM | POA: Diagnosis not present

## 2019-02-02 DIAGNOSIS — L400 Psoriasis vulgaris: Secondary | ICD-10-CM | POA: Diagnosis not present

## 2019-02-09 DIAGNOSIS — L400 Psoriasis vulgaris: Secondary | ICD-10-CM | POA: Diagnosis not present

## 2019-02-23 DIAGNOSIS — L400 Psoriasis vulgaris: Secondary | ICD-10-CM | POA: Diagnosis not present

## 2019-03-09 DIAGNOSIS — L400 Psoriasis vulgaris: Secondary | ICD-10-CM | POA: Diagnosis not present

## 2019-10-04 ENCOUNTER — Other Ambulatory Visit: Payer: Self-pay | Admitting: Neurology

## 2019-10-04 DIAGNOSIS — G40B09 Juvenile myoclonic epilepsy, not intractable, without status epilepticus: Secondary | ICD-10-CM

## 2019-10-24 ENCOUNTER — Encounter: Payer: Self-pay | Admitting: Neurology

## 2019-10-24 ENCOUNTER — Telehealth (INDEPENDENT_AMBULATORY_CARE_PROVIDER_SITE_OTHER): Payer: Commercial Managed Care - PPO | Admitting: Neurology

## 2019-10-24 ENCOUNTER — Other Ambulatory Visit: Payer: Self-pay

## 2019-10-24 VITALS — Ht 68.0 in | Wt 196.0 lb

## 2019-10-24 DIAGNOSIS — G40B09 Juvenile myoclonic epilepsy, not intractable, without status epilepticus: Secondary | ICD-10-CM | POA: Diagnosis not present

## 2019-10-24 MED ORDER — PHENYTOIN SODIUM EXTENDED 100 MG PO CAPS: ORAL_CAPSULE | ORAL | 3 refills | Status: DC

## 2019-10-24 NOTE — Progress Notes (Signed)
Virtual Visit via Video Note The purpose of this virtual visit is to provide medical care while limiting exposure to the novel coronavirus.    Consent was obtained for video visit:  Yes.   Answered questions that patient had about telehealth interaction:  Yes.   I discussed the limitations, risks, security and privacy concerns of performing an evaluation and management service by telemedicine. I also discussed with the patient that there may be a patient responsible charge related to this service. The patient expressed understanding and agreed to proceed.  Pt location: Home Physician Location: office Name of referring provider:  Saguier, Percell Miller, PA-C I connected with Jacob Morales at patients initiation/request on 10/24/2019 at  8:30 AM EST by video enabled telemedicine application and verified that I am speaking with the correct person using two identifiers. Pt MRN:  [id number] Pt DOB:  Jan 12, 1961 Video Participants:  Jacob Morales   History of Present Illness:  The patient was seen as a virtual video visit on 10/24/2019. 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 He continues to do well seizure-free since age 58 on Dilantin 500mg  qhs. He denies any staring/unresponsive episodes, myoclonic jerks, olfactory/gustatory hallucinations, focal numbness/tingling/weakness. No headaches, dizziness, vision changes, no falls. His last vitamin D level in 09/2018 was low, he has been seeing his PCP for this and takes daily vitamin D supplement. Sleep and mood are good. He reports he is doing well.   Seizure History: This is a very pleasant 58 yo RH man with a history of seizures since age 58. He reports hitting the back of his head at age 58, there was no loss of consciousness at that time, then a few months later he had his first seizure described as twitching of the arms. This did not progress to a generalized tonic-clonic seizure. A few months later, he had 2 more episodes of  body twitching, one time he fell due to leg twitching. He was diagnosed with seizures at that time and started on Dilantin. His first generalized tonic-clonic seizure occurred at age 58, in the setting of stopping his medication. His mother had described a blank stare and then a generalized convulsion. He has had 3 GTCs in his lifetime, provoked by sleep deprivation and missing medications. The last GTC was at age 58. He reports that if he would miss Dilantin for a week, he would feel the twitching coming on. The last time this happened was several years ago. He denies any staring or unresponsive episodes, gaps in time, olfactory or gustatory hallucinations, focal numbness, tingling, or weakness. He has been taking Dilantin 500 mg at bedtime for many years.  He currently works as an Insurance underwriter.   Epilepsy Risk Factors: He had a normal birth and early development. There is no history of febrile convulsions, CNS infections such as meningitis/encephalitis, significant traumatic brain injury, neurosurgical procedures, or family history of seizures.   Prior AEDs: None except Dilantin  Diagnostic Data: Last EEG and MRI were more than 10 years ago in [place], he vaguely recalls her the EEG as being abnormal.    MEDICATIONS:  Outpatient Encounter Medications as of 10/24/2019  Medication Sig  . calcipotriene-betamethasone (TACLONEX) ointment APPLY TO AFFECTED AREA EVERY DAY  . celecoxib (CELEBREX) 200 MG capsule Take 1 capsule (200 mg total) by mouth 2 (two) times daily.  . Cholecalciferol (VITAMIN D3) 1.25 MG (50000 UT) TABS Take 50,000 Units by mouth once a week.  . phenytoin (DILANTIN) 100  MG ER capsule TAKE 5 CAPSULES BY MOUTH AT BEDTIME   No facility-administered encounter medications on file as of 10/24/2019.      Observations/Objective:   Vitals:   10/24/19 0824  Weight: 196 lb (88.9 kg)  Height: 5\' 8"  (1.727 m)   GEN:  The patient appears stated age and is in  NAD.  Neurological examination: Patient is awake, alert, oriented x 3. No aphasia or dysarthria. Intact fluency and comprehension. Remote and recent memory intact. Able to name and repeat. Cranial nerves: Extraocular movements intact with no nystagmus. No facial asymmetry. Motor: moves all extremities symmetrically, at least anti-gravity x 4. No incoordination on finger to nose testing. Gait: narrow-based and steady, able to tandem walk adequately. Negative Romberg test.   Assessment and Plan:   This is a very pleasant 58 yo RH man RH man with a history of seizures since age 58. By description, seizures are suggestive of a primary generalized epilepsy with myoclonic jerks and generalized tonic-clonic seizures. He denies any history of absence seizures. Seizures have been well-controlled on Dilantin 500mg  qhs, last GTC at age 58. 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 Continue to monitor bone health and vitamin D level with PCP. He is aware of [place] driving laws to stop driving after a seizure, until 6 months seizure-free. He will follow-up in 1 year and knows to call our office for any problems in the interim.   Follow Up Instructions:  -I discussed the assessment and treatment plan with the patient. The patient was provided an opportunity to ask questions and all were answered. The patient agreed with the plan and demonstrated an understanding of the instructions.   The patient was advised to call back or seek an in-person evaluation if the symptoms worsen or if the condition fails to improve as anticipated.    Cameron Sprang, MD

## 2019-11-02 ENCOUNTER — Ambulatory Visit (INDEPENDENT_AMBULATORY_CARE_PROVIDER_SITE_OTHER): Payer: Commercial Managed Care - PPO | Admitting: Medical

## 2019-11-02 ENCOUNTER — Encounter: Payer: Self-pay | Admitting: Medical

## 2019-11-02 ENCOUNTER — Other Ambulatory Visit: Payer: Self-pay

## 2019-11-02 VITALS — BP 136/90 | HR 62 | Temp 95.9°F | Resp 18 | Ht 68.0 in | Wt 197.0 lb

## 2019-11-02 DIAGNOSIS — Z0000 Encounter for general adult medical examination without abnormal findings: Secondary | ICD-10-CM | POA: Diagnosis not present

## 2019-11-02 DIAGNOSIS — Z1211 Encounter for screening for malignant neoplasm of colon: Secondary | ICD-10-CM

## 2019-11-02 DIAGNOSIS — Z125 Encounter for screening for malignant neoplasm of prostate: Secondary | ICD-10-CM

## 2019-11-02 DIAGNOSIS — L409 Psoriasis, unspecified: Secondary | ICD-10-CM | POA: Diagnosis not present

## 2019-11-02 DIAGNOSIS — R7989 Other specified abnormal findings of blood chemistry: Secondary | ICD-10-CM

## 2019-11-02 MED ORDER — CALCIPOTRIENE-BETAMETH DIPROP 0.005-0.064 % EX OINT: TOPICAL_OINTMENT | CUTANEOUS | 0 refills | Status: DC

## 2019-11-02 NOTE — Progress Notes (Signed)
Subjective:    Patient ID: Jacob Morales, male    DOB: 08/09/1961, 59 y.o.   MRN: [id number]  HPI  Pt in for CPE.  He has not eaten in 8 hours.  Works at [employer]. He hast been exercising since last visit.Marland Kitchen He is not aware of number of steps he gets a day. Rare coffee. Rare soda. Non smoker. Moderate healthy diet.  Pt not sure when he had last colonoscopy. I placed referral gain today.  Pt is taking probiotics. Gets at [employer].  Low vitamin D in the past.  Pt has some psoriasis and he used calcipotriene-betamethsasone ointment. He has seen dermatologist in the past. In the past I filled to help patient. He used the medication only as needed. Pt states on average will place on skin the days on and 3 days off.  Pt last saw dermatologist in September.     Review of Systems  Constitutional: Negative for chills, fatigue and unexpected weight change.  Respiratory: Negative for cough, chest tightness, shortness of breath and wheezing.   Cardiovascular: Negative for chest pain and palpitations.  Gastrointestinal: Negative for abdominal pain, diarrhea and rectal pain.  Genitourinary: Negative for dysuria and flank pain.  Musculoskeletal: Negative for back pain.  Skin: Negative for rash.       See hpi.   Neurological: Negative for dizziness, weakness, numbness and headaches.  Hematological: Negative for adenopathy. Does not bruise/bleed easily.  Psychiatric/Behavioral: Negative for behavioral problems, dysphoric mood and sleep disturbance. The patient is not nervous/anxious.    Past Medical History:  Diagnosis Date  . Psoriasis   . Seizure disorder [facility])      Social History   Socioeconomic History  . Marital status: Single    Spouse name: Not on file  . Number of children: Not on file  . Years of education: Not on file  . Highest education level: Not on file  Occupational History  . Not on file  Tobacco Use  . Smoking status: Never Smoker  .  Smokeless tobacco: Never Used  Substance and Sexual Activity  . Alcohol use: Yes    Alcohol/week: 0.0 standard drinks    Comment: occasional  . Drug use: No  . Sexual activity: Yes    Birth control/protection: Condom  Other Topics Concern  . Not on file  Social History Narrative   Right handed      Highest level of edu - ADN      Lives in one story home   Social Determinants of Health   Financial Resource Strain:   . Difficulty of Paying Living Expenses: Not on file  Food Insecurity:   . Worried About Charity fundraiser in the Last Year: Not on file  . Ran Out of Food in the Last Year: Not on file  Transportation Needs:   . Lack of Transportation (Medical): Not on file  . Lack of Transportation (Non-Medical): Not on file  Physical Activity:   . Days of Exercise per Week: Not on file  . Minutes of Exercise per Session: Not on file  Stress:   . Feeling of Stress : Not on file  Social Connections:   . Frequency of Communication with Friends and Family: Not on file  . Frequency of Social Gatherings with Friends and Family: Not on file  . Attends Religious Services: Not on file  . Active Member of Clubs or Organizations: Not on file  . Attends Archivist Meetings: Not on  file  . Marital Status: Not on file  Intimate Partner Violence:   . Fear of Current or Ex-Partner: Not on file  . Emotionally Abused: Not on file  . Physically Abused: Not on file  . Sexually Abused: Not on file    No past surgical history on file.  Family History  Problem Relation Age of Onset  . Alcohol abuse Father   . Cancer Maternal Grandfather     Allergies  Allergen Reactions  . Meloxicam Rash  . Tricor [Fenofibrate] Rash    Current Outpatient Medications on File Prior to Visit  Medication Sig Dispense Refill  . calcipotriene-betamethasone (TACLONEX) ointment APPLY TO AFFECTED AREA EVERY DAY 60 g 0  . phenytoin (DILANTIN) 100 MG ER capsule Take 5 capsules every night 450  capsule 3   No current facility-administered medications on file prior to visit.    BP 136/90 (BP Location: Right Arm, Patient Position: Sitting, Cuff Size: Normal)   Pulse 62   Temp (!) 95.9 F (35.5 C) (Temporal)   Resp 18   Ht 5\' 8"  (1.727 m)   Wt 197 lb (89.4 kg)   SpO2 98%   BMI 29.95 kg/m        Objective:   Physical Exam  General Mental Status- Alert. General Appearance- Not in acute distress.   Skin General: Color- Normal Color. Moisture- Normal Moisture.  Neck Carotid Arteries- Normal color. Moisture- Normal Moisture. No carotid bruits. No JVD.  Chest and Lung Exam Auscultation: Breath Sounds:-Normal.  Cardiovascular Auscultation:Rythm- Regular. Murmurs & Other Heart Sounds:Auscultation of the heart reveals- No Murmurs.  Abdomen Inspection:-Inspeection Normal. Palpation/Percussion:Note:No mass. Palpation and Percussion of the abdomen reveal- Non Tender, Non Distended + BS, no rebound or guarding.    Neurologic Cranial Nerve exam:- CN III-XII intact(No nystagmus), symmetric smile. Strength:- 5/5 equal and symmetric strength both upper and lower extremities.   Derm- scattered small dry scaly red area on back(psoriasis type appearance.). scalp dry area around ears. But no worrisome moles or lesions on inspection.     Assessment & Plan:  For you wellness exam today I have ordered cbc, cmp, vitamin d, lipid and psa.  Vaccine up to date..   Recommend exercise and healthy diet.  We will let you know lab results as they come in.  Follow up date appointment will be determined after lab review.

## 2019-11-02 NOTE — Patient Instructions (Addendum)
For you wellness exam today I have ordered cbc, cmp, vitamin d, lipid and psa.  Vaccine up to date..   Recommend exercise and healthy diet.  We will let you know lab results as they come in.  Follow up date appointment will be determined after lab review.   Reflled talconex for psoriasis.    Preventive Care 3-61 Years Old, Male Preventive care refers to lifestyle choices and visits with your health care provider that can promote health and wellness. This includes:  A yearly physical exam. This is also called an annual well check.  Regular dental and eye exams.  Immunizations.  Screening for certain conditions.  Healthy lifestyle choices, such as eating a healthy diet, getting regular exercise, not using drugs or products that contain nicotine and tobacco, and limiting alcohol use. What can I expect for my preventive care visit? Physical exam Your health care provider will check:  Height and weight. These may be used to calculate body mass index (BMI), which is a measurement that tells if you are at a healthy weight.  Heart rate and blood pressure.  Your skin for abnormal spots. Counseling Your health care provider may ask you questions about:  Alcohol, tobacco, and drug use.  Emotional well-being.  Home and relationship well-being.  Sexual activity.  Eating habits.  Work and work Statistician. What immunizations do I need?  Influenza (flu) vaccine  This is recommended every year. Tetanus, diphtheria, and pertussis (Tdap) vaccine  You may need a Td booster every 10 years. Varicella (chickenpox) vaccine  You may need this vaccine if you have not already been vaccinated. Zoster (shingles) vaccine  You may need this after age 36. Measles, mumps, and rubella (MMR) vaccine  You may need at least one dose of MMR if you were born in 1957 or later. You may also need a second dose. Pneumococcal conjugate (PCV13) vaccine  You may need this if you have certain  conditions and were not previously vaccinated. Pneumococcal polysaccharide (PPSV23) vaccine  You may need one or two doses if you smoke cigarettes or if you have certain conditions. Meningococcal conjugate (MenACWY) vaccine  You may need this if you have certain conditions. Hepatitis A vaccine  You may need this if you have certain conditions or if you travel or work in places where you may be exposed to hepatitis A. Hepatitis B vaccine  You may need this if you have certain conditions or if you travel or work in places where you may be exposed to hepatitis B. Haemophilus influenzae type b (Hib) vaccine  You may need this if you have certain risk factors. Human papillomavirus (HPV) vaccine  If recommended by your health care provider, you may need three doses over 6 months. You may receive vaccines as individual doses or as more than one vaccine together in one shot (combination vaccines). Talk with your health care provider about the risks and benefits of combination vaccines. What tests do I need? Blood tests  Lipid and cholesterol levels. These may be checked every 5 years, or more frequently if you are over 76 years old.  Hepatitis C test.  Hepatitis B test. Screening  Lung cancer screening. You may have this screening every year starting at age 80 if you have a 30-pack-year history of smoking and currently smoke or have quit within the past 15 years.  Prostate cancer screening. Recommendations will vary depending on your family history and other risks.  Colorectal cancer screening. All adults should have this screening  starting at age 76 and continuing until age 51. Your health care provider may recommend screening at age 6 if you are at increased risk. You will have tests every 1-10 years, depending on your results and the type of screening test.  Diabetes screening. This is done by checking your blood sugar (glucose) after you have not eaten for a while (fasting). You may  have this done every 1-3 years.  Sexually transmitted disease (STD) testing. Follow these instructions at home: Eating and drinking  Eat a diet that includes fresh fruits and vegetables, whole grains, lean protein, and low-fat dairy products.  Take vitamin and mineral supplements as recommended by your health care provider.  Do not drink alcohol if your health care provider tells you not to drink.  If you drink alcohol: ? Limit how much you have to 0-2 drinks a day. ? Be aware of how much alcohol is in your drink. In the [place] one drink equals one 12 oz bottle of beer (355 mL), one 5 oz glass of wine (148 mL), or one 1 oz glass of hard liquor (44 mL). Lifestyle  Take daily care of your teeth and gums.  Stay active. Exercise for at least 30 minutes on 5 or more days each week.  Do not use any products that contain nicotine or tobacco, such as cigarettes, e-cigarettes, and chewing tobacco. If you need help quitting, ask your health care provider.  If you are sexually active, practice safe sex. Use a condom or other form of protection to prevent STIs (sexually transmitted infections).  Talk with your health care provider about taking a low-dose aspirin every day starting at age 65. What's next?  Go to your health care provider once a year for a well check visit.  Ask your health care provider how often you should have your eyes and teeth checked.  Stay up to date on all vaccines. This information is not intended to replace advice given to you by your health care provider. Make sure you discuss any questions you have with your health care provider. Document Revised: 10/07/2018 Document Reviewed: 10/07/2018 Elsevier Patient Education  2020 [employer].

## 2019-11-03 ENCOUNTER — Telehealth: Payer: Self-pay | Admitting: Medical

## 2019-11-03 LAB — PSA: PSA: 0.87 ng/mL (ref 0.10–4.00)

## 2019-11-03 LAB — COMPREHENSIVE METABOLIC PANEL
ALT: 19 U/L (ref 0–53)
AST: 19 U/L (ref 0–37)
Albumin: 4.5 g/dL (ref 3.5–5.2)
Alkaline Phosphatase: 82 U/L (ref 39–117)
BUN: 19 mg/dL (ref 6–23)
CO2: 27 mEq/L (ref 19–32)
Calcium: 9.4 mg/dL (ref 8.4–10.5)
Chloride: 102 mEq/L (ref 96–112)
Creatinine, Ser: 0.81 mg/dL (ref 0.40–1.50)
GFR: 97 mL/min (ref >60.00)
Glucose, Bld: 83 mg/dL (ref 70–99)
Potassium: 4.3 mEq/L (ref 3.5–5.1)
Sodium: 138 mEq/L (ref 135–145)
Total Bilirubin: 0.4 mg/dL (ref 0.2–1.2)
Total Protein: 6.9 g/dL (ref 6.0–8.3)

## 2019-11-03 LAB — CBC WITH DIFFERENTIAL/PLATELET
Basophils Absolute: 0.1 10*3/uL (ref 0.0–0.1)
Basophils Relative: 1.3 % (ref 0.0–3.0)
Eosinophils Absolute: 0.1 10*3/uL (ref 0.0–0.7)
Eosinophils Relative: 2.3 % (ref 0.0–5.0)
HCT: 43.2 % (ref 39.0–52.0)
Hemoglobin: 14.4 g/dL (ref 13.0–17.0)
Lymphocytes Relative: 39.8 % (ref 12.0–46.0)
Lymphs Abs: 1.9 10*3/uL (ref 0.7–4.0)
MCHC: 33.3 g/dL (ref 30.0–36.0)
MCV: 89.6 fl (ref 78.0–100.0)
Monocytes Absolute: 0.4 10*3/uL (ref 0.1–1.0)
Monocytes Relative: 9.1 % (ref 3.0–12.0)
Neutro Abs: 2.3 10*3/uL (ref 1.4–7.7)
Neutrophils Relative %: 47.5 % (ref 43.0–77.0)
Platelets: 172 10*3/uL (ref 150.0–400.0)
RBC: 4.82 Mil/uL (ref 4.22–5.81)
RDW: 13.7 % (ref 11.5–15.5)
WBC: 4.7 10*3/uL (ref 4.0–10.5)

## 2019-11-03 LAB — LIPID PANEL
Cholesterol: 261 mg/dL — ABNORMAL HIGH (ref 0–200)
HDL: 44.3 mg/dL (ref >39.00)
LDL Cholesterol: 178 mg/dL — ABNORMAL HIGH (ref 0–99)
NonHDL: 216.85
Total CHOL/HDL Ratio: 6
Triglycerides: 195 mg/dL — ABNORMAL HIGH (ref 0.0–149.0)
VLDL: 39 mg/dL (ref 0.0–40.0)

## 2019-11-03 MED ORDER — ATORVASTATIN CALCIUM 10 MG PO TABS: 10.0000 mg | ORAL_TABLET | Freq: Every day | ORAL | 3 refills | Status: DC

## 2019-11-03 NOTE — Telephone Encounter (Signed)
Rx atorvastatin sent to pt pharmacy. 

## 2019-11-04 ENCOUNTER — Telehealth: Payer: Self-pay

## 2019-11-04 NOTE — Telephone Encounter (Signed)
Received a fax stating Cacipotriene-Betameth is not covered by insurance.Alternatives are Dsoxmetasne, Fluocnonde, or Bryhali Non Formulary

## 2019-11-07 LAB — VITAMIN D 1,25 DIHYDROXY
Vitamin D 1, 25 (OH)2 Total: 42 pg/mL (ref 18–72)
Vitamin D2 1, 25 (OH)2: <8 pg/mL
Vitamin D3 1, 25 (OH)2: 42 pg/mL

## 2019-11-09 NOTE — Telephone Encounter (Signed)
Notify pt that pharmacy could not fill stating not covered. Other options only steroid so not exact match to what he has been using.(his rx had 2 components)  I would recommend follow up with dermatologist. Sometimes they may have sample of equivalent medication or come up with different treatment option.  Derm filled rx initially. I was trying to help him out and refill. But advise defer to his dermatologist.

## 2019-11-09 NOTE — Telephone Encounter (Signed)
LVM for patient to call back. ?

## 2019-11-10 ENCOUNTER — Other Ambulatory Visit: Payer: Self-pay | Admitting: Medical

## 2019-11-10 DIAGNOSIS — L409 Psoriasis, unspecified: Secondary | ICD-10-CM

## 2019-11-11 ENCOUNTER — Encounter: Payer: Self-pay | Admitting: Family Medicine

## 2019-11-11 ENCOUNTER — Other Ambulatory Visit: Payer: Self-pay

## 2019-11-11 ENCOUNTER — Ambulatory Visit (INDEPENDENT_AMBULATORY_CARE_PROVIDER_SITE_OTHER): Payer: Commercial Managed Care - PPO | Admitting: Family Medicine

## 2019-11-11 DIAGNOSIS — Z20822 Contact with and (suspected) exposure to covid-19: Secondary | ICD-10-CM | POA: Diagnosis not present

## 2019-11-11 MED ORDER — CALCIPOTRIENE-BETAMETH DIPROP 0.005-0.064 % EX OINT: TOPICAL_OINTMENT | CUTANEOUS | 0 refills | Status: DC

## 2019-11-11 NOTE — Progress Notes (Addendum)
Virtual Visit via Telephone Note  I connected with Anirvin Slowe on 11/11/19 at  3:00 PM EST by telephone and verified that I am speaking with the correct person using two identifiers.  Location: Patient: home  Provider: home    I discussed the limitations, risks, security and privacy concerns of performing an evaluation and management service by telephone and the availability of in person appointments. I also discussed with the patient that there may be a patient responsible charge related to this service. The patient expressed understanding and agreed to proceed.   History of Present Illness: Pt was exposed to  covid-- coworker tested positive yesterday.  Pt has no symptoms and they all wear masks at work -- he does not work in close proximity to the coworker   Observations/Objective: .There were no vitals filed for this visit. Pt is in NAD  Assessment and Plan:  1. Contact with and (suspected) exposure to covid-19 Pt should wait at least 6 days from exposure to get tested.  He has no symptoms  He will arrange to get tested Wednesday or sooner if symptoms develop  Pt is ok to work---- con't to wear mask  He was not in close proximity to the covid + coworker   Follow Up Instructions:    I discussed the assessment and treatment plan with the patient. The patient was provided an opportunity to ask questions and all were answered. The patient agreed with the plan and demonstrated an understanding of the instructions.   The patient was advised to call back or seek an in-person evaluation if the symptoms worsen or if the condition fails to improve as anticipated.  VV lasted 20 min    Ann Held, DO

## 2019-11-11 NOTE — Telephone Encounter (Signed)
LVM for patient to call back. ?

## 2019-11-14 ENCOUNTER — Other Ambulatory Visit: Payer: Self-pay

## 2019-11-14 ENCOUNTER — Ambulatory Visit: Payer: Commercial Managed Care - PPO | Attending: Internal Medicine

## 2019-11-14 DIAGNOSIS — Z20822 Contact with and (suspected) exposure to covid-19: Secondary | ICD-10-CM

## 2019-11-15 LAB — NOVEL CORONAVIRUS, NAA: SARS-CoV-2, NAA: NOT DETECTED

## 2019-12-19 ENCOUNTER — Encounter: Payer: Self-pay | Admitting: Gastroenterology

## 2020-02-09 ENCOUNTER — Encounter: Payer: Commercial Managed Care - PPO | Admitting: Gastroenterology

## 2020-02-15 ENCOUNTER — Other Ambulatory Visit: Payer: Self-pay

## 2020-02-15 ENCOUNTER — Ambulatory Visit (AMBULATORY_SURGERY_CENTER): Payer: Self-pay

## 2020-02-15 VITALS — Temp 96.5°F | Ht 68.0 in | Wt 198.0 lb

## 2020-02-15 DIAGNOSIS — Z860100 Personal history of colon polyps, unspecified: Secondary | ICD-10-CM

## 2020-02-15 MED ORDER — CLENPIQ 10-3.5-12 MG-GM -GM/160ML PO SOLN: 1.0000 | Freq: Once | ORAL | 0 refills | Status: AC

## 2020-02-15 NOTE — Progress Notes (Signed)
No egg or soy allergy known to patient  No issues with past sedation with any surgeries  or procedures, no intubation problems  No diet pills per patient No home 02 use per patient  No blood thinners per patient  Pt denies issues with constipation  No A fib or A flutter  EMMI video sent to pt's e mail   [place] 02/14/20  CLENPIQ COUPON GIVEN  Due to the COVID-19 pandemic we are asking patients to follow these guidelines. Please only bring one care partner. Please be aware that your care partner may wait in the car in the parking lot or if they feel like they will be too hot to wait in the car, they may wait in the lobby on the 4th floor. All care partners are required to wear a mask the entire time (we do not have any that we can provide them), they need to practice social distancing, and we will do a Covid check for all patient's and care partners when you arrive. Also we will check their temperature and your temperature. If the care partner waits in their car they need to stay in the parking lot the entire time and we will call them on their cell phone when the patient is ready for discharge so they can bring the car to the front of the building. Also all patient's will need to wear a mask into building.

## 2020-02-29 ENCOUNTER — Other Ambulatory Visit: Payer: Self-pay

## 2020-02-29 ENCOUNTER — Ambulatory Visit (AMBULATORY_SURGERY_CENTER): Payer: Commercial Managed Care - PPO | Admitting: Gastroenterology

## 2020-02-29 ENCOUNTER — Encounter: Payer: Self-pay | Admitting: Gastroenterology

## 2020-02-29 VITALS — BP 132/87 | HR 70 | Temp 96.8°F | Resp 13 | Ht 68.0 in | Wt 198.0 lb

## 2020-02-29 DIAGNOSIS — Z86010 Personal history of colonic polyps: Secondary | ICD-10-CM

## 2020-02-29 DIAGNOSIS — D125 Benign neoplasm of sigmoid colon: Secondary | ICD-10-CM | POA: Diagnosis not present

## 2020-02-29 DIAGNOSIS — D124 Benign neoplasm of descending colon: Secondary | ICD-10-CM | POA: Diagnosis not present

## 2020-02-29 MED ORDER — SODIUM CHLORIDE 0.9 % IV SOLN: 500.0000 mL | INTRAVENOUS | Status: DC

## 2020-02-29 NOTE — Progress Notes (Signed)
Report given to PACU, vss 

## 2020-02-29 NOTE — Patient Instructions (Addendum)
YOU HAD AN ENDOSCOPIC PROCEDURE TODAY AT [facility]:   Refer to the procedure report that was given to you for any specific questions about what was found during the examination.  If the procedure report does not answer your questions, please call your gastroenterologist to clarify.  If you requested that your care partner not be given the details of your procedure findings, then the procedure report has been included in a sealed envelope for you to review at your convenience later.  YOU SHOULD EXPECT: Some feelings of bloating in the abdomen. Passage of more gas than usual.  Walking can help get rid of the air that was put into your GI tract during the procedure and reduce the bloating. If you had a lower endoscopy (such as a colonoscopy or flexible sigmoidoscopy) you may notice spotting of blood in your stool or on the toilet paper. If you underwent a bowel prep for your procedure, you may not have a normal bowel movement for a few days.  Please Note:  You might notice some irritation and congestion in your nose or some drainage.  This is from the oxygen used during your procedure.  There is no need for concern and it should clear up in a day or so.  SYMPTOMS TO REPORT IMMEDIATELY:   Following lower endoscopy (colonoscopy or flexible sigmoidoscopy):  Excessive amounts of blood in the stool  Significant tenderness or worsening of abdominal pains  Swelling of the abdomen that is new, acute  Fever of 100F or higher   For urgent or emergent issues, a gastroenterologist can be reached at any hour by calling [phone]. Do not use MyChart messaging for urgent concerns.    DIET:  We do recommend a small meal at first, but then you may proceed to your regular diet.  Drink plenty of fluids but you should avoid alcoholic beverages for 24 hours.  ACTIVITY:  You should plan to take it easy for the rest of today and you should NOT DRIVE or use heavy machinery until tomorrow (because  of the sedation medicines used during the test).    FOLLOW UP: Our staff will call the number listed on your records 48-72 hours following your procedure to check on you and address any questions or concerns that you may have regarding the information given to you following your procedure. If we do not reach you, we will leave a message.  We will attempt to reach you two times.  During this call, we will ask if you have developed any symptoms of COVID 19. If you develop any symptoms (ie: fever, flu-like symptoms, shortness of breath, cough etc.) before then, please call [phone].  If you test positive for Covid 19 in the 2 weeks post procedure, please call and report this information to [place].    If any biopsies were taken you will be contacted by phone or by letter within the next 1-3 weeks.  Please call us at [phone] if you have not heard about the biopsies in 3 weeks.    SIGNATURES/CONFIDENTIALITY: You and/or your care partner have signed paperwork which will be entered into your electronic medical record.  These signatures attest to the fact that that the information above on your After Visit Summary has been reviewed and is understood.  Full responsibility of the confidentiality of this discharge information lies with you and/or your care-partner.     Handouts were given to you on polyps, diverticulosis, hemorrhoids, and a high fiber diet  with liberal fluid intake. Add BENEFIBER  (OTC) one teaspoon by mouth daily with 8 ounces of water. You may resume your current medications today. Await biopsy results. Please call if any questions or concerns.

## 2020-02-29 NOTE — Op Note (Signed)
[facility] Patient Name: Jacob Morales Procedure Date: 02/29/2020 9:33 AM MRN: [id number] Endoscopist: Jackquline Denmark , MD Age: 59 Referring MD:  Date of Birth: [birth year] Gender: Male Account #: [id number] Procedure:                Colonoscopy Indications:              High risk colon cancer surveillance: Personal                            history of colonic polyps Medicines:                Monitored Anesthesia Care Procedure:                Pre-Anesthesia Assessment:                           - Prior to the procedure, a History and Physical                            was performed, and patient medications and                            allergies were reviewed. The patient's tolerance of                            previous anesthesia was also reviewed. The risks                            and benefits of the procedure and the sedation                            options and risks were discussed with the patient.                            All questions were answered, and informed consent                            was obtained. Prior Anticoagulants: The patient has                            taken no previous anticoagulant or antiplatelet                            agents. ASA Grade Assessment: II - A patient with                            mild systemic disease. After reviewing the risks                            and benefits, the patient was deemed in                            satisfactory condition to undergo the procedure.  After obtaining informed consent, the colonoscope                            was passed under direct vision. Throughout the                            procedure, the patient's blood pressure, pulse, and                            oxygen saturations were monitored continuously. The                            Colonoscope was introduced through the anus and                            advanced to the 2 cm into the ileum. The                             colonoscopy was performed without difficulty. The                            patient tolerated the procedure well. The quality                            of the bowel preparation was good. The terminal                            ileum, ileocecal valve, appendiceal orifice, and                            rectum were photographed. Scope In: 9:35:18 AM Scope Out: 9:50:02 AM Scope Withdrawal Time: 0 hours 10 minutes 36 seconds  Total Procedure Duration: 0 hours 14 minutes 44 seconds  Findings:                 Two sessile polyps were found in the distal sigmoid                            colon and mid descending colon. The polyps were 5                            to 6 mm in size. These polyps were removed with a                            cold snare. Resection and retrieval were complete.                           Multiple medium-mouthed diverticula were found in                            the sigmoid colon and descending colon.                           Non-bleeding internal hemorrhoids were  found during                            retroflexion. The hemorrhoids were small.                           The terminal ileum appeared normal.                           The exam was otherwise without abnormality on                            direct and retroflexion views. Complications:            No immediate complications. Estimated Blood Loss:     Estimated blood loss: none. Impression:               -Colonic polyps s/p polypectomy.                           -Moderate left colonic diverticulosis.                           -Otherwise normal colonoscopy to TI. Recommendation:           - Patient has a contact number available for                            emergencies. The signs and symptoms of potential                            delayed complications were discussed with the                            patient. Return to normal activities tomorrow.                             Written discharge instructions were provided to the                            patient.                           - High fiber diet.                           - Continue present medications.                           - Repeat colonoscopy for surveillance based on                            pathology results.                           - Use Benefiber one teaspoon PO daily with 8 ounces  of water.                           - The findings and recommendations were discussed                            with the patient's family. Jackquline Denmark, MD 02/29/2020 9:54:40 AM This report has been signed electronically.

## 2020-02-29 NOTE — Progress Notes (Signed)
Temp JB  v/s cw I have reviewed the patient's medical history in detail and updated the computerized patient record.

## 2020-02-29 NOTE — Progress Notes (Addendum)
No problems noted in the recovery room. Maw  Pt voided clear, yellow urine when admitted to the recovery room.  400 ml voided.  maw

## 2020-02-29 NOTE — Progress Notes (Signed)
Called to room to assist during endoscopic procedure.  Patient ID and intended procedure confirmed with present staff. Received instructions for my participation in the procedure from the performing physician.  

## 2020-03-02 ENCOUNTER — Telehealth: Payer: Self-pay

## 2020-03-02 NOTE — Telephone Encounter (Signed)
  Follow up Call-  Call back number 02/29/2020  Post procedure Call Back phone  # [phone]  Permission to leave phone message Yes  Some recent data might be hidden     Patient questions:  Do you have a fever, pain , or abdominal swelling? No. Pain Score  0 *  Have you tolerated food without any problems? Yes.    Have you been able to return to your normal activities? Yes.    Do you have any questions about your discharge instructions: Diet   No. Medications  No. Follow up visit  No.  Do you have questions or concerns about your Care? No.  Actions: * If pain score is 4 or above: No action needed, pain <4.  1. Have you developed a fever since your procedure? no  2.   Have you had an respiratory symptoms (SOB or cough) since your procedure? no  3.   Have you tested positive for COVID 19 since your procedure no  4.   Have you had any family members/close contacts diagnosed with the COVID 19 since your procedure?  no   If yes to any of these questions please route to Joylene John, RN and Erenest Rasher, RN

## 2020-03-04 ENCOUNTER — Encounter: Payer: Self-pay | Admitting: Gastroenterology

## 2020-03-14 ENCOUNTER — Other Ambulatory Visit: Payer: Self-pay

## 2020-03-14 ENCOUNTER — Telehealth: Payer: Self-pay | Admitting: Neurology

## 2020-03-14 DIAGNOSIS — G40B09 Juvenile myoclonic epilepsy, not intractable, without status epilepticus: Secondary | ICD-10-CM

## 2020-03-14 MED ORDER — PHENYTOIN SODIUM EXTENDED 100 MG PO CAPS: ORAL_CAPSULE | ORAL | 3 refills | Status: DC

## 2020-03-14 NOTE — Telephone Encounter (Signed)
Sent to Dr. Aquino 

## 2020-03-14 NOTE — Telephone Encounter (Signed)
Patient left message with [employer] 03/13/20 @ 10:13PM:  "Caller states he needs a refill on his Rx. Nothing has been sent to the pharmacy. He has one day left. Dilantin - Rx. Caller denies any current symptoms needing triaged at this time."

## 2020-05-10 ENCOUNTER — Encounter: Payer: Self-pay | Admitting: Medical

## 2020-05-17 ENCOUNTER — Telehealth: Payer: Self-pay | Admitting: Medical

## 2020-05-17 DIAGNOSIS — L409 Psoriasis, unspecified: Secondary | ICD-10-CM

## 2020-05-17 NOTE — Telephone Encounter (Signed)
Referral to derm placed.

## 2020-10-23 ENCOUNTER — Other Ambulatory Visit: Payer: Self-pay

## 2020-10-23 ENCOUNTER — Ambulatory Visit: Payer: Commercial Managed Care - PPO | Admitting: Neurology

## 2020-10-23 ENCOUNTER — Encounter: Payer: Self-pay | Admitting: Neurology

## 2020-10-23 VITALS — BP 119/82 | HR 81 | Ht 68.0 in | Wt 206.0 lb

## 2020-10-23 DIAGNOSIS — Z79899 Other long term (current) drug therapy: Secondary | ICD-10-CM

## 2020-10-23 DIAGNOSIS — G40B09 Juvenile myoclonic epilepsy, not intractable, without status epilepticus: Secondary | ICD-10-CM

## 2020-10-23 MED ORDER — PHENYTOIN SODIUM EXTENDED 100 MG PO CAPS: ORAL_CAPSULE | ORAL | 3 refills | Status: DC

## 2020-10-23 NOTE — Progress Notes (Signed)
NEUROLOGY FOLLOW UP OFFICE NOTE  Jacob Morales [id number] 1961/10/12  HISTORY OF PRESENT ILLNESS: I had the pleasure of seeing Jacob Morales in follow-up in the neurology clinic on 10/23/2020.  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 He is alone in the office today. He continues to do well seizure-free since age 59 on Dilantin 500mg  qhs. He denies any staring/unresponsive episodes, gaps in time, olfactory/gustatory hallucinations, focal numbness/tingling/weakness, myoclonic jerks. No headaches, dizziness, double vision, no falls. Sleep and mood are good. He is asking about psoriasis medications and potential interactions with Dilantin which were cross-checked today with no interactions listed.  Laboratory Data: 11/02/2019 vitamin D 42  Seizure History: This is a very pleasant 59 yo RH man with a history of seizures since age 85. He reports hitting the back of his head at age 71, there was no loss of consciousness at that time, then a few months later he had his first seizure described as twitching of the arms. This did not progress to a generalized tonic-clonic seizure. A few months later, he had 2 more episodes of body twitching, one time he fell due to leg twitching. He was diagnosed with seizures at that time and started on Dilantin. His first generalized tonic-clonic seizure occurred at age 13, in the setting of stopping his medication. His mother had described a blank stare and then a generalized convulsion. He has had 3 GTCs in his lifetime, provoked by sleep deprivation and missing medications. The last GTC was at age 54. He reports that if he would miss Dilantin for a week, he would feel the twitching coming on. The last time this happened was several years ago. He denies any staring or unresponsive episodes, gaps in time, olfactory or gustatory hallucinations, focal numbness, tingling, or weakness. He has been taking Dilantin 500 mg at bedtime for many  years.  He currently works as an 30.   Epilepsy Risk Factors: He had a normal birth and early development. There is no history of febrile convulsions, CNS infections such as meningitis/encephalitis, significant traumatic brain injury, neurosurgical procedures, or family history of seizures.   Prior AEDs: None except Dilantin  Diagnostic Data: Last EEG and MRI were more than 10 years ago in [place], he vaguely recalls her the EEG as being abnormal.   PAST MEDICAL HISTORY: Past Medical History:  Diagnosis Date  . Allergy    seasonal  . Asthma    due to allergy in [place], treated x1  . Hyperlipidemia   . Psoriasis   . Seizure disorder (HCC)    last seizure at age 1.    MEDICATIONS: Current Outpatient Medications on File Prior to Visit  Medication Sig Dispense Refill  . acetaminophen (TYLENOL) 325 MG tablet Take 650 mg by mouth every 6 (six) hours as needed.    30 albuterol (VENTOLIN HFA) 108 (90 Base) MCG/ACT inhaler Inhale 2 puffs into the lungs every 6 (six) hours as needed for wheezing or shortness of breath.    Marland Kitchen atorvastatin (LIPITOR) 10 MG tablet Take 1 tablet (10 mg total) by mouth daily. 30 tablet 3  . loratadine (CLARITIN) 10 MG tablet Take 10 mg by mouth daily as needed for allergies.    . phenytoin (DILANTIN) 100 MG ER capsule Take 5 capsules every night 450 capsule 3   No current facility-administered medications on file prior to visit.    ALLERGIES: Allergies  Allergen Reactions  . Meloxicam Rash  . Tricor [  Fenofibrate] Rash    FAMILY HISTORY: Family History  Problem Relation Age of Onset  . Alcohol abuse Father   . Cancer Maternal Grandfather   . Colon cancer Neg Hx   . Colon polyps Neg Hx   . Esophageal cancer Neg Hx   . Rectal cancer Neg Hx   . Stomach cancer Neg Hx     SOCIAL HISTORY: Social History   Socioeconomic History  . Marital status: Single    Spouse name: Not on file  . Number of children: Not on file  . Years  of education: Not on file  . Highest education level: Not on file  Occupational History  . Not on file  Tobacco Use  . Smoking status: Never Smoker  . Smokeless tobacco: Never Used  Vaping Use  . Vaping Use: Never used  Substance and Sexual Activity  . Alcohol use: Yes    Alcohol/week: 1.0 standard drink    Types: 1 Cans of beer per week    Comment: occasional  . Drug use: No  . Sexual activity: Yes    Birth control/protection: Condom  Other Topics Concern  . Not on file  Social History Narrative   Right handed      Highest level of edu - ADN      Lives in one story home   Social Determinants of Health   Financial Resource Strain: Not on file  Food Insecurity: Not on file  Transportation Needs: Not on file  Physical Activity: Not on file  Stress: Not on file  Social Connections: Not on file  Intimate Partner Violence: Not on file     PHYSICAL EXAM: Vitals:   10/23/20 0822  BP: 119/82  Pulse: 81  SpO2: 96%   General: No acute distress Head:  Normocephalic/atraumatic Skin/Extremities: No rash, no edema Neurological Exam: alert and awake. No aphasia or dysarthria. Fund of knowledge is appropriate.  Recent and remote memory are intact.  Attention and concentration are normal.   Cranial nerves: Pupils equal, round. Extraocular movements intact with no nystagmus. Visual fields full.  No facial asymmetry.  Motor: Bulk and tone normal, muscle strength 5/5 throughout with no pronator drift.   Finger to nose testing intact.  Gait narrow-based and steady, able to tandem walk adequately.  Romberg negative.   IMPRESSION: This is a very pleasant 59 yo RH man with a history of seizures since age 33. By description, seizures are suggestive of a primary generalized epilepsy with myoclonic jerks and generalized tonic-clonic seizures. He denies any history of absence seizures. He continues to do well seizure-free since age 57 on Dilantin 500mg  qhs, no side effects. Continue to  monitor bone health every 3 years and vitamin D level, bone density scan ordered today. He is aware of [place] driving laws to stop driving after a seizure until 6 months seizure-free. Follow-up in 1 year, he knows to call for any changes.    Thank you for allowing me to participate in his care.  Please do not hesitate to call for any questions or concerns.   , M.D.   CC: Patrcia Dolly, PA-C

## 2020-10-23 NOTE — Patient Instructions (Signed)
Always good to see you!  1. Schedule bone density scan  2. Continue Dilantin 500mg  every night. Refills for a whole year have been sent to your pharmacy  3. Follow-up in 1 year, call for any changes   Seizure Precautions: 1. If medication has been prescribed for you to prevent seizures, take it exactly as directed.  Do not stop taking the medicine without talking to your doctor first, even if you have not had a seizure in a long time.   2. Avoid activities in which a seizure would cause danger to yourself or to others.  Don't operate dangerous machinery, swim alone, or climb in high or dangerous places, such as on ladders, roofs, or girders.  Do not drive unless your doctor says you may.  3. If you have any warning that you may have a seizure, lay down in a safe place where you can't hurt yourself.    4.  No driving for 6 months from last seizure, as per [facility].   Please refer to the following link on the [employer]ile[employer] of [employer]'s website for more information: http://www.epilepsyfoundation.org/answerplace/Social/driving/drivingu.cfm   5.  Maintain good sleep hygiene. Avoid alcohol.  6.  Contact your doctor if you have any problems that may be related to the medicine you are taking.  7.  Call 911 and bring the patient back to the ED if:        A.  The seizure lasts longer than 5 minutes.       B.  The patient doesn't awaken shortly after the seizure  C.  The patient has new problems such as difficulty seeing, speaking or moving  D.  The patient was injured during the seizure  E.  The patient has a temperature over 102 F (39C)  F.  The patient vomited and now is having trouble breathing

## 2020-11-21 ENCOUNTER — Other Ambulatory Visit: Payer: Self-pay | Admitting: Neurology

## 2020-11-21 DIAGNOSIS — G40B09 Juvenile myoclonic epilepsy, not intractable, without status epilepticus: Secondary | ICD-10-CM

## 2020-11-21 DIAGNOSIS — Z79899 Other long term (current) drug therapy: Secondary | ICD-10-CM

## 2021-04-02 ENCOUNTER — Ambulatory Visit
Admission: RE | Admit: 2021-04-02 | Discharge: 2021-04-02 | Disposition: A | Discharge status: Home / Self Care | Payer: Commercial Managed Care - PPO | Source: Ambulatory Visit | Attending: Neurology | Admitting: Neurology

## 2021-04-02 ENCOUNTER — Other Ambulatory Visit: Payer: Self-pay

## 2021-04-02 DIAGNOSIS — Z79899 Other long term (current) drug therapy: Secondary | ICD-10-CM

## 2021-04-02 DIAGNOSIS — G40B09 Juvenile myoclonic epilepsy, not intractable, without status epilepticus: Secondary | ICD-10-CM

## 2021-04-03 ENCOUNTER — Telehealth: Payer: Self-pay

## 2021-04-03 NOTE — Telephone Encounter (Signed)
Pt called to go over lab results no answer left a message to call the office back

## 2021-04-03 NOTE — Telephone Encounter (Signed)
-----   Message from Cameron Sprang, MD sent at 04/03/2021  8:44 AM EDT ----- Pls let him know that the bone density scan showed normal bone mass, continue to monitor vitamin D levels with PCP. thanks

## 2021-04-15 NOTE — Progress Notes (Signed)
Patient advised of dexa

## 2021-05-10 ENCOUNTER — Encounter: Payer: Self-pay | Admitting: Physician Assistant

## 2021-05-10 ENCOUNTER — Telehealth: Payer: Commercial Managed Care - PPO | Admitting: Physician Assistant

## 2021-05-10 DIAGNOSIS — U071 COVID-19: Secondary | ICD-10-CM

## 2021-05-10 MED ORDER — MOLNUPIRAVIR EUA 200MG CAPSULE: 4.0000 | ORAL_CAPSULE | Freq: Two times a day (BID) | ORAL | 0 refills | Status: AC

## 2021-05-10 NOTE — Progress Notes (Signed)
No show

## 2021-05-10 NOTE — Progress Notes (Signed)
Mr. kayvon, mo are scheduled for a virtual visit with your provider today.    Just as we do with appointments in the office, we must obtain your consent to participate.  Your consent will be active for this visit and any virtual visit you may have with one of our providers in the next 365 days.    If you have a MyChart account, I can also send a copy of this consent to you electronically.  All virtual visits are billed to your insurance company just like a traditional visit in the office.  As this is a virtual visit, video technology does not allow for your provider to perform a traditional examination.  This may limit your provider's ability to fully assess your condition.  If your provider identifies any concerns that need to be evaluated in person or the need to arrange testing such as labs, EKG, etc, we will make arrangements to do so.    Although advances in technology are sophisticated, we cannot ensure that it will always work on either your end or our end.  If the connection with a video visit is poor, we may have to switch to a telephone visit.  With either a video or telephone visit, we are not always able to ensure that we have a secure connection.   I need to obtain your verbal consent now.   Are you willing to proceed with your visit today?   ALHAJI MCNEAL has provided verbal consent on 05/10/2021 for a virtual visit (video or telephone).   Abigail Butts, PA-C 05/10/2021  6:26 PM   Date:  05/10/2021   ID:  Sunny, Gains January 06, 1961, MRN [id number]  Patient Location: Home Provider Location: Home Office   Participants: Patient and Provider for Visit and Wrap up  Method of visit: Video  Location of Patient: Home Location of Provider: Home Office Consent was obtain for visit over the video. Services rendered by provider: Visit was performed via video  A video enabled telemedicine application was used and I verified that I am speaking with the correct  person using two identifiers.  PCP:  Mackie Pai, PA-C   Chief Complaint:  PA-C  History of Present Illness:    KAIL FRALEY is a 59 y.o. male with history as stated below. Presents video telehealth for an acute care visit  Onset of symptoms was Wednesday and symptoms have been persistent and include: headache, low grade temperature, nasal congestion  Pt reports positive home COVID test tonight.   Modifying factors include: tylenol helps the headache, but it has not resolved.  No other aggravating or relieving factors.  No other c/o.  Pt is fully vaccinated; no booster  The patient does have symptoms concerning for COVID-19 infection (fever, chills, cough, or new shortness of breath).  Patient has been tested for COVID during this illness - result: positive  Past Medical, Surgical, Social History, Allergies, and Medications have been Reviewed.  Patient Active Problem List   Diagnosis Date Noted   Contact with and (suspected) exposure to covid-19 11/11/2019   Blepharitis 12/24/2015   Need for diphtheria-tetanus-pertussis (Tdap) vaccine 12/24/2015   Elevated triglycerides with high cholesterol 12/24/2015   Nonintractable juvenile myoclonic epilepsy without status epilepticus ([place]) 09/25/2015   Visit for preventive health examination 09/19/2014   Prostate cancer screening 09/19/2014   Chronic fatigue 09/19/2014   Encounter to establish care 11/20/2013   Psoriasis 11/16/2013   Seizure disorder ([place]) 11/16/2013   Hyperlipidemia 11/16/2013    Social  History   Tobacco Use   Smoking status: Never   Smokeless tobacco: Never  Substance Use Topics   Alcohol use: Yes    Alcohol/week: 1.0 standard drink    Types: 1 Cans of beer per week    Comment: occasional     Current Outpatient Medications:    acetaminophen (TYLENOL) 325 MG tablet, Take 650 mg by mouth every 6 (six) hours as needed., Disp: , Rfl:    albuterol (VENTOLIN HFA) 108 (90 Base) MCG/ACT inhaler,  Inhale 2 puffs into the lungs every 6 (six) hours as needed for wheezing or shortness of breath., Disp: , Rfl:    atorvastatin (LIPITOR) 10 MG tablet, Take 1 tablet (10 mg total) by mouth daily., Disp: 30 tablet, Rfl: 3   loratadine (CLARITIN) 10 MG tablet, Take 10 mg by mouth daily as needed for allergies., Disp: , Rfl:    phenytoin (DILANTIN) 100 MG ER capsule, Take 5 capsules every night, Disp: 450 capsule, Rfl: 3   Allergies  Allergen Reactions   Meloxicam Rash   Tricor [Fenofibrate] Rash     Review of Systems  Constitutional:  Positive for fever. Negative for chills.  HENT:  Positive for congestion. Negative for ear pain and sore throat.   Eyes:  Negative for blurred vision and double vision.  Respiratory:  Negative for cough, shortness of breath and wheezing.   Cardiovascular:  Negative for chest pain, palpitations and leg swelling.  Gastrointestinal:  Negative for abdominal pain, diarrhea, nausea and vomiting.  Genitourinary:  Negative for dysuria.  Musculoskeletal:  Negative for myalgias.  Skin:  Negative for rash.  Neurological:  Positive for headaches. Negative for loss of consciousness and weakness.  Psychiatric/Behavioral:  The patient is not nervous/anxious.   See HPI for history of present illness.  Physical Exam Constitutional:      General: He is not in acute distress.    Appearance: Normal appearance. He is well-developed. He is not ill-appearing.  HENT:     Head: Normocephalic and atraumatic.     Nose: Nose normal.  Eyes:     General: No scleral icterus.    Conjunctiva/sclera: Conjunctivae normal.  Pulmonary:     Effort: Pulmonary effort is normal.     Comments: Speaks in complete sentences Musculoskeletal:        General: Normal range of motion.     Cervical back: Normal range of motion.  Skin:    Coloration: Skin is not pale.  Neurological:     General: No focal deficit present.     Mental Status: He is alert.  Psychiatric:        Mood and Affect:  Mood normal.              A&P  1. COVID-19  - molnupiravir  - OTC therapies discussed for symptom control  - f/u with PCP as needed; to ER as needed for shortness of breath    Patient voiced understanding and agreement to plan.   Time:   Today, I have spent 15 minutes with the patient with telehealth technology discussing the above problems, reviewing the chart, previous notes, medications and orders.    Tests Ordered: No orders of the defined types were placed in this encounter.   Medication Changes: No orders of the defined types were placed in this encounter.    Disposition:  Follow up with PCP as needed; to ER for worsening symptoms  Signed, Abigail Butts, PA-C  05/10/2021 6:26 PM

## 2021-05-10 NOTE — Patient Instructions (Signed)
1. COVID-19  - molnupiravir  - OTC therapies discussed for symptom control  - f/u with PCP as needed; to ER as needed for shortness of breath

## 2021-10-23 ENCOUNTER — Encounter: Payer: Self-pay | Admitting: Neurology

## 2021-10-23 ENCOUNTER — Other Ambulatory Visit: Payer: Self-pay

## 2021-10-23 ENCOUNTER — Ambulatory Visit: Payer: Commercial Managed Care - PPO | Admitting: Neurology

## 2021-10-23 DIAGNOSIS — G40B09 Juvenile myoclonic epilepsy, not intractable, without status epilepticus: Secondary | ICD-10-CM | POA: Diagnosis not present

## 2021-10-23 MED ORDER — PHENYTOIN SODIUM EXTENDED 100 MG PO CAPS: ORAL_CAPSULE | ORAL | 3 refills | Status: DC

## 2021-10-23 NOTE — Patient Instructions (Signed)
Always good to see you.  Check vitamin D level  2. Continue Dilantin 500mg  every night  3. Follow-up in 1 year, call for any changes   Seizure Precautions: 1. If medication has been prescribed for you to prevent seizures, take it exactly as directed.  Do not stop taking the medicine without talking to your doctor first, even if you have not had a seizure in a long time.   2. Avoid activities in which a seizure would cause danger to yourself or to others.  Don't operate dangerous machinery, swim alone, or climb in high or dangerous places, such as on ladders, roofs, or girders.  Do not drive unless your doctor says you may.  3. If you have any warning that you may have a seizure, lay down in a safe place where you can't hurt yourself.    4.  No driving for 6 months from last seizure, as per [facility].   Please refer to the following link on the [place] website for more information: http://www.epilepsyfoundation.org/answerplace/Social/driving/drivingu.cfm   5.  Maintain good sleep hygiene. Avoid alcohol.  6.  Contact your doctor if you have any problems that may be related to the medicine you are taking.  7.  Call 911 and bring the patient back to the ED if:        A.  The seizure lasts longer than 5 minutes.       B.  The patient doesn't awaken shortly after the seizure  C.  The patient has new problems such as difficulty seeing, speaking or moving  D.  The patient was injured during the seizure  E.  The patient has a temperature over 102 F (39C)  F.  The patient vomited and now is having trouble breathing

## 2021-10-23 NOTE — Progress Notes (Signed)
NEUROLOGY FOLLOW UP OFFICE NOTE  PLUMER MITTELSTAEDT [id number] 1961/06/28  HISTORY OF PRESENT ILLNESS: I had the pleasure of seeing Jacob Morales in follow-up in the neurology clinic on 10/23/2021.  The patient was last seen a year ago for well-controlled primary generalized epilepsy. He is alone in the office today. He continues to do well seizure-free since age 60 on Dilantin 500mg  qhs. He denies any myoclonic jerks, no staring/unresponsive episodes, gaps in time, olfactory/gustatory hallucinations, focal numbness/tingling/weakness. No significant headaches, dizziness, diplopia, no falls. Sleep is okay. Mood is calm. He lives with his girlfriend and continues to work at [employer]. His bone density scan in 03/2021 was normal. He is asking about his vitamin D level, he has not had it checked recently and has not been on supplements.    Seizure History: This is a very pleasant 60 yo RH man with a history of seizures since age 37. He reports hitting the back of his head at age 23, there was no loss of consciousness at that time, then a few months later he had his first seizure described as twitching of the arms. This did not progress to a generalized tonic-clonic seizure. A few months later, he had 2 more episodes of body twitching, one time he fell due to leg twitching. He was diagnosed with seizures at that time and started on Dilantin. His first generalized tonic-clonic seizure occurred at age 22, in the setting of stopping his medication. His mother had described a blank stare and then a generalized convulsion. He has had 3 GTCs in his lifetime, provoked by sleep deprivation and missing medications. The last GTC was at age 56. He reports that if he would miss Dilantin for a week, he would feel the twitching coming on. The last time this happened was several years ago. He denies any staring or unresponsive episodes, gaps in time, olfactory or gustatory hallucinations, focal numbness, tingling, or  weakness. He has been taking Dilantin 500 mg at bedtime for many years.  He currently works as an Insurance underwriter.   Epilepsy Risk Factors: He had a normal birth and early development. There is no history of febrile convulsions, CNS infections such as meningitis/encephalitis, significant traumatic brain injury, neurosurgical procedures, or family history of seizures.   Prior AEDs: None except Dilantin  Diagnostic Data: Last EEG and MRI were more than 10 years ago in [place], he vaguely recalls her the EEG as being abnormal.   PAST MEDICAL HISTORY: Past Medical History:  Diagnosis Date   Allergy    seasonal   Asthma    due to allergy in [place], treated x1   Hyperlipidemia    Psoriasis    Seizure disorder ([place])    last seizure at age 75.    MEDICATIONS: Current Outpatient Medications on File Prior to Visit  Medication Sig Dispense Refill   acetaminophen (TYLENOL) 325 MG tablet Take 650 mg by mouth every 6 (six) hours as needed.     albuterol (VENTOLIN HFA) 108 (90 Base) MCG/ACT inhaler Inhale 2 puffs into the lungs every 6 (six) hours as needed for wheezing or shortness of breath.     loratadine (CLARITIN) 10 MG tablet Take 10 mg by mouth daily as needed for allergies.     phenytoin (DILANTIN) 100 MG ER capsule Take 5 capsules every night 450 capsule 3   Risankizumab-rzaa,150 MG Dose, (SKYRIZI, 150 MG DOSE,) 75 MG/0.83ML PSKT      No current facility-administered medications on file prior  to visit.    ALLERGIES: Allergies  Allergen Reactions   Meloxicam Rash   Tricor [Fenofibrate] Rash    FAMILY HISTORY: Family History  Problem Relation Age of Onset   Alcohol abuse Father    Cancer Maternal Grandfather    Colon cancer Neg Hx    Colon polyps Neg Hx    Esophageal cancer Neg Hx    Rectal cancer Neg Hx    Stomach cancer Neg Hx     SOCIAL HISTORY: Social History   Socioeconomic History   Marital status: Single    Spouse name: Not on file   Number of  children: Not on file   Years of education: Not on file   Highest education level: Not on file  Occupational History   Not on file  Tobacco Use   Smoking status: Never   Smokeless tobacco: Never  Vaping Use   Vaping Use: Never used  Substance and Sexual Activity   Alcohol use: Yes    Alcohol/week: 1.0 standard drink    Types: 1 Cans of beer per week    Comment: occasional   Drug use: No   Sexual activity: Yes    Birth control/protection: Condom  Other Topics Concern   Not on file  Social History Narrative   Right handed      Highest level of edu - ADN      Lives in one story home   Social Determinants of Health   Financial Resource Strain: Not on file  Food Insecurity: Not on file  Transportation Needs: Not on file  Physical Activity: Not on file  Stress: Not on file  Social Connections: Not on file  Intimate Partner Violence: Not on file     PHYSICAL EXAM: Vitals:   10/23/21 0824  BP: (!) 154/87  Pulse: 76  SpO2: 96%   General: No acute distress Head:  Normocephalic/atraumatic Skin/Extremities: No rash, no edema Neurological Exam: alert and awake. No aphasia or dysarthria. Fund of knowledge is appropriate.  Attention and concentration are normal.   Cranial nerves: Pupils equal, round. Extraocular movements intact with no nystagmus. Visual fields full.  No facial asymmetry.  Motor: Bulk and tone normal, muscle strength 5/5 throughout with no pronator drift.   Finger to nose testing intact.  Gait narrow-based and steady, able to tandem walk adequately.  Romberg negative.   IMPRESSION: This is a very pleasant 60 yo RH man with a history of seizures since age 49. By description, seizures are suggestive of a primary generalized epilepsy with myoclonic jerks and generalized tonic-clonic seizures. He denies any history of absence seizures. He has been seizure-free since age 70 on Dilantin 500mg  qhs, refills sent. Bone density scan in 03/2021 normal, continue to monitor  bone health every 3 years, check vitamin D level today. He is aware of [place] driving laws to stop driving after a seizure until 6 months seizure-free. Follow-up in 1 year, call for any changes.    Thank you for allowing me to participate in his care.  Please do not hesitate to call for any questions or concerns.   Ellouise Newer, M.D.   CC: Mackie Pai, PA-C

## 2021-12-17 ENCOUNTER — Ambulatory Visit: Payer: Commercial Managed Care - PPO | Admitting: Medical

## 2021-12-17 VITALS — BP 127/80 | HR 95 | Resp 18 | Ht 68.0 in | Wt 209.6 lb

## 2021-12-17 DIAGNOSIS — J301 Allergic rhinitis due to pollen: Secondary | ICD-10-CM

## 2021-12-17 DIAGNOSIS — R040 Epistaxis: Secondary | ICD-10-CM

## 2021-12-17 DIAGNOSIS — S0992XA Unspecified injury of nose, initial encounter: Secondary | ICD-10-CM | POA: Diagnosis not present

## 2021-12-17 MED ORDER — LEVOCETIRIZINE DIHYDROCHLORIDE 5 MG PO TABS: 5.0000 mg | ORAL_TABLET | Freq: Every evening | ORAL | 3 refills | Status: DC

## 2021-12-17 NOTE — Progress Notes (Addendum)
° °  Subjective:    Patient ID: Jacob Morales, male    DOB: 07-Jun-1961, 61 y.o.   MRN: [id number]  HPI Pt states about 5-6 weeks  ago he hit himself rt side of nose with hammer at home.  Pt states nose bled for 20 minutes that day. Only trickle bleed then stopped.  Then 2 weeks ago had some nasal congestion and blew his nose. He states nose bleed heavily. He said that day lasted 25 minutes.  Soaked klenex.  Hx of allergies in the spring.  States in January he cleaned attic and breathed in a lot dust.   Then last week got another nose. Bleed but lighter last week.    Review of Systems  Constitutional:  Negative for chills and fatigue.  HENT:  Negative for dental problem.   Respiratory:  Negative for cough, chest tightness, shortness of breath and wheezing.   Cardiovascular:  Negative for chest pain and palpitations.  Gastrointestinal:  Negative for abdominal pain, blood in stool and constipation.  Genitourinary:  Negative for dysuria and flank pain.  Musculoskeletal:  Negative for back pain and gait problem.  Hematological:  Does not bruise/bleed easily.       Nose bleeds.      Objective:   Physical Exam General- No acute distress. Pleasant patient. Neck- Full range of motion, no jvd Lungs- Clear, even and unlabored. Heart- regular rate and rhythm. Neurologic- CNII- XII grossly intact.  HEENT-no frontal or maxillary sinus pressure.  On inspection of nose does have bilateral boggy turbinates.  No obvious areas of bleeding presently in nares on inspection.  Underneath eyes does have appearance of allergic shiners. Rt side of nose. Where he hit nose shows no obvious assymetry.     Assessment & Plan:   Patient Instructions  Nasal bridge trauma around 6 weeks ago.  You hit right side of nose with hammer.  Nose bled for about 20 minutes significantly and moderate swelling over the next week.  I decided to go ahead and put x-ray order for nasal bones.  Designated location at  [facility].  You can call them to get hours of operation the.  This might show small healed fracture?  Recent nosebleeds over the last 4 weeks.  There might be an allergy component to these nosebleeds.  Prescribed Xyzal antihistamine to use daily.  Also with intermittent cold weather he might have some nosebleeds from the anterior portion of the nose as described.  For this you could use Vaseline to the anterior tip of nose twice daily.  Also run humidifier at night.  Not allergies typically do flare in spring and recent nasal congestion occurred after heavy dust exposure.  If you do get a nosebleed that persisting despite applying pressure could use Afrin to the tip of a soaked plug as demonstrated.  Afrin can vasoconstrict and to stop nosebleed.  I will get CBC to assess platelets today.   Follow-up in 2 weeks or sooner if needed.  If nosebleeds resolve completely and allergy symptoms over the next 2 to 4 weeks then at that point think is reasonable to use Flonase nasal spray.  But would hold off presently on any steroid use.   Mackie Pai, PA-C   Mackie Pai, PA-C

## 2021-12-17 NOTE — Patient Instructions (Addendum)
Nasal bridge trauma around 6 weeks ago.  You hit right side of nose with hammer.  Nose bled for about 20 minutes significantly and moderate swelling over the next week.  I decided to go ahead and put x-ray order for nasal bones.  Designated location at [facility].  You can call them to get hours of operation the.  This might show small healed fracture?  Recent nosebleeds over the last 4 weeks.  There might be an allergy component to these nosebleeds.  Prescribed Xyzal antihistamine to use daily.  Also with intermittent cold weather he might have some nosebleeds from the anterior portion of the nose as described.  For this you could use Vaseline to the anterior tip of nose twice daily.  Also run humidifier at night.  Not allergies typically do flare in spring and recent nasal congestion occurred after heavy dust exposure.  If you do get a nosebleed that persisting despite applying pressure could use Afrin to the tip of a soaked plug as demonstrated.  Afrin can vasoconstrict and to stop nosebleed.  I will get CBC to assess platelets today.   Follow-up in 2 weeks or sooner if needed.  If nosebleeds resolve completely and allergy symptoms over the next 2 to 4 weeks then at that point think is reasonable to use Flonase nasal spray.  But would hold off presently on any steroid use.

## 2021-12-18 LAB — CBC WITH DIFFERENTIAL/PLATELET
Basophils Absolute: 0.1 10*3/uL (ref 0.0–0.1)
Basophils Relative: 2 % (ref 0.0–3.0)
Eosinophils Absolute: 0.2 10*3/uL (ref 0.0–0.7)
Eosinophils Relative: 5 % (ref 0.0–5.0)
HCT: 42.8 % (ref 39.0–52.0)
Hemoglobin: 14.4 g/dL (ref 13.0–17.0)
Lymphocytes Relative: 40.5 % (ref 12.0–46.0)
Lymphs Abs: 1.9 10*3/uL (ref 0.7–4.0)
MCHC: 33.6 g/dL (ref 30.0–36.0)
MCV: 88.1 fl (ref 78.0–100.0)
Monocytes Absolute: 0.3 10*3/uL (ref 0.1–1.0)
Monocytes Relative: 7.4 % (ref 3.0–12.0)
Neutro Abs: 2.1 10*3/uL (ref 1.4–7.7)
Neutrophils Relative %: 45.1 % (ref 43.0–77.0)
Platelets: 173 10*3/uL (ref 150.0–400.0)
RBC: 4.86 Mil/uL (ref 4.22–5.81)
RDW: 13.7 % (ref 11.5–15.5)
WBC: 4.7 10*3/uL (ref 4.0–10.5)

## 2022-01-03 ENCOUNTER — Other Ambulatory Visit: Payer: Commercial Managed Care - PPO

## 2022-01-03 ENCOUNTER — Ambulatory Visit: Payer: Commercial Managed Care - PPO | Admitting: Medical

## 2022-01-03 VITALS — BP 129/85 | HR 80 | Temp 98.2°F | Resp 18 | Ht 68.0 in | Wt 206.8 lb

## 2022-01-03 DIAGNOSIS — Z0000 Encounter for general adult medical examination without abnormal findings: Secondary | ICD-10-CM

## 2022-01-03 DIAGNOSIS — E559 Vitamin D deficiency, unspecified: Secondary | ICD-10-CM

## 2022-01-03 DIAGNOSIS — Z125 Encounter for screening for malignant neoplasm of prostate: Secondary | ICD-10-CM | POA: Diagnosis not present

## 2022-01-03 DIAGNOSIS — L409 Psoriasis, unspecified: Secondary | ICD-10-CM

## 2022-01-03 DIAGNOSIS — Z1159 Encounter for screening for other viral diseases: Secondary | ICD-10-CM

## 2022-01-03 LAB — CBC WITH DIFFERENTIAL/PLATELET
Absolute Monocytes: 311 cells/uL (ref 200–950)
Basophils Absolute: 59 cells/uL (ref 0–200)
Basophils Relative: 1.4 %
Eosinophils Absolute: 382 cells/uL (ref 15–500)
Eosinophils Relative: 9.1 %
HCT: 45.1 % (ref 38.5–50.0)
Hemoglobin: 16.3 g/dL (ref 13.2–17.1)
Lymphs Abs: 1609 cells/uL (ref 850–3900)
MCH: 32 pg (ref 27.0–33.0)
MCHC: 36.1 g/dL — ABNORMAL HIGH (ref 32.0–36.0)
MCV: 88.4 fL (ref 80.0–100.0)
MPV: 10.4 fL (ref 7.5–12.5)
Monocytes Relative: 7.4 %
Neutro Abs: 1840 cells/uL (ref 1500–7800)
Neutrophils Relative %: 43.8 %
Platelets: 183 10*3/uL (ref 140–400)
RBC: 5.1 10*6/uL (ref 4.20–5.80)
RDW: 13.8 % (ref 11.0–15.0)
Total Lymphocyte: 38.3 %
WBC: 4.2 10*3/uL (ref 3.8–10.8)

## 2022-01-03 LAB — COMPREHENSIVE METABOLIC PANEL
ALT: 24 U/L (ref 0–53)
AST: 29 U/L (ref 0–37)
Albumin: 4.6 g/dL (ref 3.5–5.2)
Alkaline Phosphatase: 85 U/L (ref 39–117)
BUN: 17 mg/dL (ref 6–23)
CO2: 27 mEq/L (ref 19–32)
Calcium: 9.5 mg/dL (ref 8.4–10.5)
Chloride: 102 mEq/L (ref 96–112)
Creatinine, Ser: 0.74 mg/dL (ref 0.40–1.50)
GFR: 96.92 mL/min (ref >60.00)
Glucose, Bld: 115 mg/dL — ABNORMAL HIGH (ref 70–99)
Potassium: 4.7 mEq/L (ref 3.5–5.1)
Sodium: 136 mEq/L (ref 135–145)
Total Bilirubin: 0.2 mg/dL (ref 0.2–1.2)
Total Protein: 6.8 g/dL (ref 6.0–8.3)

## 2022-01-03 LAB — LIPID PANEL
Cholesterol: 272 mg/dL — ABNORMAL HIGH (ref 0–200)
HDL: 29.8 mg/dL — ABNORMAL LOW (ref >39.00)
Total CHOL/HDL Ratio: 9
Triglycerides: 2099 mg/dL — ABNORMAL HIGH (ref 0.0–149.0)

## 2022-01-03 LAB — LDL CHOLESTEROL, DIRECT: Direct LDL: 75 mg/dL

## 2022-01-03 LAB — PSA: PSA: 0.47 ng/mL (ref 0.10–4.00)

## 2022-01-03 NOTE — Patient Instructions (Addendum)
For you wellness exam today I have ordered cbc, cmp, lipid panel and psa ? ?None give today. Declined/discussed needed vaccine today.  ? ?Recommend exercise and healthy diet. ? ?We will let you know lab results as they come in. ? ?Follow up date appointment will be determined after lab review.   ? ?For vit d def added vit D lab today. ? ?Preventive Care 63-63 Years Old, Male ?Preventive care refers to lifestyle choices and visits with your health care provider that can promote health and wellness. Preventive care visits are also called wellness exams. ?What can I expect for my preventive care visit? ?Counseling ?During your preventive care visit, your health care provider may ask about your: ?Medical history, including: ?Past medical problems. ?Family medical history. ?Current health, including: ?Emotional well-being. ?Home life and relationship well-being. ?Sexual activity. ?Lifestyle, including: ?Alcohol, nicotine or tobacco, and drug use. ?Access to firearms. ?Diet, exercise, and sleep habits. ?Safety issues such as seatbelt and bike helmet use. ?Sunscreen use. ?Work and work Statistician. ?Physical exam ?Your health care provider will check your: ?Height and weight. These may be used to calculate your BMI (body mass index). BMI is a measurement that tells if you are at a healthy weight. ?Waist circumference. This measures the distance around your waistline. This measurement also tells if you are at a healthy weight and may help predict your risk of certain diseases, such as type 2 diabetes and high blood pressure. ?Heart rate and blood pressure. ?Body temperature. ?Skin for abnormal spots. ?What immunizations do I need? ?Vaccines are usually given at various ages, according to a schedule. Your health care provider will recommend vaccines for you based on your age, medical history, and lifestyle or other factors, such as travel or where you work. ?What tests do I need? ?Screening ?Your health care provider may  recommend screening tests for certain conditions. This may include: ?Lipid and cholesterol levels. ?Diabetes screening. This is done by checking your blood sugar (glucose) after you have not eaten for a while (fasting). ?Hepatitis B test. ?Hepatitis C test. ?HIV (human immunodeficiency virus) test. ?STI (sexually transmitted infection) testing, if you are at risk. ?Lung cancer screening. ?Prostate cancer screening. ?Colorectal cancer screening. ?Talk with your health care provider about your test results, treatment options, and if necessary, the need for more tests. ?Follow these instructions at home: ?Eating and drinking ? ?Eat a diet that includes fresh fruits and vegetables, whole grains, lean protein, and low-fat dairy products. ?Take vitamin and mineral supplements as recommended by your health care provider. ?Do not drink alcohol if your health care provider tells you not to drink. ?If you drink alcohol: ?Limit how much you have to 0-2 drinks a day. ?Know how much alcohol is in your drink. In the [place] one drink equals one 12 oz bottle of beer (355 mL), one 5 oz glass of wine (148 mL), or one 1? oz glass of hard liquor (44 mL). ?Lifestyle ?Brush your teeth every morning and night with fluoride toothpaste. Floss one time each day. ?Exercise for at least 30 minutes 5 or more days each week. ?Do not use any products that contain nicotine or tobacco. These products include cigarettes, chewing tobacco, and vaping devices, such as e-cigarettes. If you need help quitting, ask your health care provider. ?Do not use drugs. ?If you are sexually active, practice safe sex. Use a condom or other form of protection to prevent STIs. ?Take aspirin only as told by your health care provider. Make sure that you  understand how much to take and what form to take. Work with your health care provider to find out whether it is safe and beneficial for you to take aspirin daily. ?Find healthy ways to manage stress, such  as: ?Meditation, yoga, or listening to music. ?Journaling. ?Talking to a trusted person. ?Spending time with friends and family. ?Minimize exposure to UV radiation to reduce your risk of skin cancer. ?Safety ?Always wear your seat belt while driving or riding in a vehicle. ?Do not drive: ?If you have been drinking alcohol. Do not ride with someone who has been drinking. ?When you are tired or distracted. ?While texting. ?If you have been using any mind-altering substances or drugs. ?Wear a helmet and other protective equipment during sports activities. ?If you have firearms in your house, make sure you follow all gun safety procedures. ?What's next? ?Go to your health care provider once a year for an annual wellness visit. ?Ask your health care provider how often you should have your eyes and teeth checked. ?Stay up to date on all vaccines. ?This information is not intended to replace advice given to you by your health care provider. Make sure you discuss any questions you have with your health care provider. ?Document Revised: 04/10/2021 Document Reviewed: 04/10/2021 ?Elsevier Patient Education ? 2022 [place]. ? ? ? ?

## 2022-01-03 NOTE — Progress Notes (Signed)
? ?Subjective:  ? ? Patient ID: Jacob Morales, male    DOB: 05-Sep-1961, 61 y.o.   MRN: [id number] ? ?HPI ?Pt not seen for wellness exam since 11-02-19. Will do wellness. ? ?Works at [employer]. He hast been exercising since last visit.Marland Kitchen He is not aware of number of steps he gets a day. No coffee. Rare soda. Non smoker. Moderate healthy diet. ? ?Flu vaccine declined. Offered shingles declined today. Briefly discussed covid vaccine. ? ?Vit D def in past. ? ?Up to date on colonoscopy. ? ? ?Review of Systems  ?Constitutional:  Negative for chills and fatigue.  ?HENT:  Negative for dental problem and nosebleeds.   ?     No nose bleeds.  ?Respiratory:  Negative for cough, chest tightness, shortness of breath and wheezing.   ?Cardiovascular:  Negative for chest pain and palpitations.  ?Gastrointestinal:  Negative for abdominal pain, blood in stool and constipation.  ?Genitourinary:  Negative for dysuria and flank pain.  ?Musculoskeletal:  Negative for back pain, gait problem and neck pain.  ?Skin:  Negative for rash.  ?Neurological:  Negative for dizziness, seizures, syncope, weakness, numbness and headaches.  ?Hematological:  Negative for adenopathy. Does not bruise/bleed easily.  ?Psychiatric/Behavioral:  Negative for behavioral problems, decreased concentration and suicidal ideas. The patient is not nervous/anxious and is not hyperactive.   ? ?Past Medical History:  ?Diagnosis Date  ? Allergy   ? seasonal  ? Asthma   ? due to allergy in [place], treated x1  ? Hyperlipidemia   ? Psoriasis   ? Seizure disorder ([place])   ? last seizure at age 44.  ? ?  ?Social History  ? ?Socioeconomic History  ? Marital status: Single  ?  Spouse name: Not on file  ? Number of children: Not on file  ? Years of education: Not on file  ? Highest education level: Not on file  ?Occupational History  ? Not on file  ?Tobacco Use  ? Smoking status: Never  ? Smokeless tobacco: Never  ?Vaping Use  ? Vaping Use: Never used  ?Substance and Sexual  Activity  ? Alcohol use: Yes  ?  Alcohol/week: 1.0 standard drink  ?  Types: 1 Cans of beer per week  ?  Comment: occasional  ? Drug use: No  ? Sexual activity: Yes  ?  Birth control/protection: Condom  ?Other Topics Concern  ? Not on file  ?Social History Narrative  ? Right handed  ?   ? Highest level of edu - ADN  ?   ? Lives in one story home  ? ?Social Determinants of Health  ? ?Financial Resource Strain: Not on file  ?Food Insecurity: Not on file  ?Transportation Needs: Not on file  ?Physical Activity: Not on file  ?Stress: Not on file  ?Social Connections: Not on file  ?Intimate Partner Violence: Not on file  ? ? ?Past Surgical History:  ?Procedure Laterality Date  ? COLONOSCOPY  2011  ? MAC  ? POLYPECTOMY    ? ? ?Family History  ?Problem Relation Age of Onset  ? Alcohol abuse Father   ? Cancer Maternal Grandfather   ? Colon cancer Neg Hx   ? Colon polyps Neg Hx   ? Esophageal cancer Neg Hx   ? Rectal cancer Neg Hx   ? Stomach cancer Neg Hx   ? ? ?Allergies  ?Allergen Reactions  ? Meloxicam Rash  ? Tricor [Fenofibrate] Rash  ? ? ?Current Outpatient Medications on File Prior to  Visit  ?Medication Sig Dispense Refill  ? acetaminophen (TYLENOL) 325 MG tablet Take 650 mg by mouth every 6 (six) hours as needed.    ? albuterol (VENTOLIN HFA) 108 (90 Base) MCG/ACT inhaler Inhale 2 puffs into the lungs every 6 (six) hours as needed for wheezing or shortness of breath.    ? levocetirizine (XYZAL) 5 MG tablet Take 1 tablet (5 mg total) by mouth every evening. 30 tablet 3  ? loratadine (CLARITIN) 10 MG tablet Take 10 mg by mouth daily as needed for allergies.    ? phenytoin (DILANTIN) 100 MG ER capsule Take 5 capsules every night 450 capsule 3  ? Risankizumab-rzaa,150 MG Dose, (SKYRIZI, 150 MG DOSE,) 75 MG/0.83ML PSKT     ? ?No current facility-administered medications on file prior to visit.  ? ? ?BP 129/85   Pulse 80   Temp 98.2 ?F (36.8 ?C)   Resp 18   Ht _0  (1.727 m)   Wt 206 lb 12.8 oz (93.8 kg)   SpO2 95%    BMI 31.44 kg/m?  ?  ?   ?Objective:  ? Physical Exam ? ?General ?Mental Status- Alert. General Appearance- Not in acute distress.  ? ?Skin ?General: Color- Normal Color. Moisture- Normal Moisture. ? ?Neck ?Carotid Arteries- Normal color. Moisture- Normal Moisture. No carotid bruits. No JVD. ? ?Chest and Lung Exam ?Auscultation: ?Breath Sounds:-Normal. ? ?Cardiovascular ?Auscultation:Rythm- Regular. ?Murmurs & Other Heart Sounds:Auscultation of the heart reveals- No Murmurs. ? ?Abdomen ?Inspection:-Inspeection Normal. ?Palpation/Percussion:Note:No mass. Palpation and Percussion of the abdomen reveal- Non Tender, Non Distended + BS, no rebound or guarding. ? ?Neurologic ?Cranial Nerve exam:- CN III-XII intact(No nystagmus), symmetric smile. ?Drift Test:- No drift. ?Romberg Exam:- Negative.  ?Heal to Toe Gait exam:-Normal. ?Finger to Nose:- Normal/Intact ?Strength:- 5/5 equal and symmetric strength both upper and lower extremities.  ? ? ?   ?Assessment & Plan:  ? ?Patient Instructions  ?For you wellness exam today I have ordered cbc, cmp, lipid panel and psa ? ?None give today. Declined/discussed needed vaccine today.  ? ?Recommend exercise and healthy diet. ? ?We will let you know lab results as they come in. ? ?Follow up date appointment will be determined after lab review.   ? ?For vit d def added vit D lab today. ? ? Mackie Pai, PA-C  ?

## 2022-01-04 MED ORDER — ICOSAPENT ETHYL 1 G PO CAPS: 2.0000 g | ORAL_CAPSULE | Freq: Two times a day (BID) | ORAL | 11 refills | Status: DC

## 2022-01-04 MED ORDER — ROSUVASTATIN CALCIUM 20 MG PO TABS: 20.0000 mg | ORAL_TABLET | Freq: Every day | ORAL | 3 refills | Status: DC

## 2022-01-04 NOTE — Addendum Note (Signed)
Addended by: Anabel Halon on: 01/04/2022 09:21 AM ? ? Modules accepted: Orders ? ?

## 2022-01-13 LAB — HEPATITIS C ANTIBODY
Hepatitis C Ab: NONREACTIVE
SIGNAL TO CUT-OFF: 0.04 (ref <1.00)

## 2022-01-13 LAB — VITAMIN D 1,25 DIHYDROXY

## 2022-02-19 ENCOUNTER — Encounter: Payer: Self-pay | Admitting: Medical

## 2022-02-24 ENCOUNTER — Ambulatory Visit (INDEPENDENT_AMBULATORY_CARE_PROVIDER_SITE_OTHER): Payer: Commercial Managed Care - PPO

## 2022-02-24 DIAGNOSIS — R0981 Nasal congestion: Secondary | ICD-10-CM

## 2022-02-24 DIAGNOSIS — S0992XA Unspecified injury of nose, initial encounter: Secondary | ICD-10-CM | POA: Diagnosis not present

## 2022-03-17 ENCOUNTER — Other Ambulatory Visit: Payer: Self-pay | Admitting: Medical

## 2022-08-01 ENCOUNTER — Ambulatory Visit (INDEPENDENT_AMBULATORY_CARE_PROVIDER_SITE_OTHER): Payer: Commercial Managed Care - PPO | Admitting: Medical

## 2022-08-01 VITALS — BP 122/82 | HR 70 | Temp 98.2°F | Resp 18 | Ht 68.0 in | Wt 206.8 lb

## 2022-08-01 DIAGNOSIS — M25522 Pain in left elbow: Secondary | ICD-10-CM | POA: Diagnosis not present

## 2022-08-01 DIAGNOSIS — R0981 Nasal congestion: Secondary | ICD-10-CM | POA: Diagnosis not present

## 2022-08-01 MED ORDER — METHYLPREDNISOLONE 4 MG PO TABS: ORAL_TABLET | ORAL | 0 refills | Status: DC

## 2022-08-01 NOTE — Patient Instructions (Addendum)
It does appear that you have lateral epicondylitis.   Recommend using tennis elbow brace. Use daily. Can apply ice to area at night and massage area.   On review/discussion minimal sick time reported.  Will go ahead and refer you to sport med MD. Sometimes steroid injection can be given to relief symptoms.  Chronic nasal congestion since May. Feel like has cold. Possible allergic rhintinis. Will rx 6 day medrol dose course. No to fill diclofenac. Steroid may treat tendinitis type pain and may clear nasal congestin.  Also placed referral to ENT due to chronicity and history of nose fracture.   Follow up with me as needed if pain worsens before sport med referral or if significant appointment delay.    Tennis Elbow  Tennis elbow is irritation and swelling (inflammation) in your outer forearm, near your elbow. Swelling affects the tissues that connect muscle to bone (tendons). Tennis elbow can happen playing any sport or doing any job where you use your elbow too much. It is caused by doing the same motion over and over. What are the causes? This condition is often caused by playing sports or doing work where you need to keep moving your forearm the same way. Sometimes, it may be caused by a sudden injury. What increases the risk? You are more likely to get tennis elbow if you play tennis or another racket sport. You also have a higher risk if you often use your hands for work. This includes: People who use computers. Architect workers. People who work in a factory. Musicians. Cooks. Cashiers. What are the signs or symptoms? Pain and tenderness in your forearm and the outer part of your elbow. You may have pain all the time or only when you use your arm. A burning feeling. This starts in your elbow and spreads down your arm. A weak grip in your hand. How is this treated? Resting and icing your arm is often the first treatment. Your doctor may also recommend: Medicines to reduce  pain and swelling. An elbow strap. Physical therapy. This may include massage or exercises or both. An elbow brace. If these do not help your symptoms get better, your doctor may recommend surgery. Follow these instructions at home: If you have a brace or strap: Wear the brace or strap as told by your doctor. Take it off only as told by your doctor. Check the skin around the brace or strap every day. Tell your doctor if you see problems. Loosen it if your fingers: Tingle. Become numb. Turn cold and blue. Keep the brace or strap clean. If the brace or strap is not waterproof: Do not let it get wet. Cover it with a watertight covering when you take a bath or a shower. Managing pain, stiffness, and swelling  If told, put ice on the injured area. To do this: If you have a removable brace or strap, take it off as told by your doctor. Put ice in a plastic bag. Place a towel between your skin and the bag. Leave the ice on for 20 minutes, 2-3 times a day. Take off the ice if your skin turns bright red. This is very important. If you cannot feel pain, heat, or cold, you have a greater risk of damage to the area. Move your fingers often. Activity Rest your elbow and wrist. Avoid activities that can cause elbow problems as told by your doctor. Do exercises as told by your doctor. If you lift an object, lift it with your  palm facing up. Lifestyle If your tennis elbow is caused by sports, check your equipment and make sure that: You are using it the right way. It fits you well. If your tennis elbow is caused by work or computer use, take breaks often to stretch your arm. Talk with your manager about how you can make your condition better at work. General instructions Take over-the-counter and prescription medicines only as told by your doctor. Do not smoke or use any products that contain nicotine or tobacco. If you need help quitting, ask your doctor. Keep all follow-up visits. How is this  prevented? Before and after being active: Warm up and stretch before being active. Cool down and stretch after being active. Give your body time to rest between activities. While being active: Make sure to use equipment that fits you. If you play tennis, put power in your stroke with your lower body. Avoid using your arm only. Maintain physical fitness. This includes: Strength. Flexibility. Endurance. Do exercises to strengthen the forearm muscles. Contact a doctor if: Your pain does not get better with treatment. Your pain gets worse. You have weakness in your forearm, hand, or fingers. You cannot feel your forearm, hand, or fingers. Get help right away if: Your pain is very bad. You cannot move your wrist. Summary Tennis elbow is irritation and swelling (inflammation) in your outer forearm, near your elbow. Tennis elbow is caused by doing the same motion over and over. Rest your elbow and wrist. Avoid activities as told by your doctor. If told, put ice on the injured area for 20 minutes, 2-3 times a day. This information is not intended to replace advice given to you by your health care provider. Make sure you discuss any questions you have with your health care provider. Document Revised: 04/24/2020 Document Reviewed: 04/24/2020 Elsevier Patient Education  [place].

## 2022-08-01 NOTE — Progress Notes (Signed)
   Subjective:    Patient ID: AUBURN HERT, male    DOB: [birth year], 61 y.o.   MRN: [id number]  HPI Pt in with left elbow pain. He points to lateral epicondyle area. Pt saw work provider and stated tennis elbow. 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 He states ibuprofen. Pt has this for 2 weeks.   Pt states when he uses ibuprofen 200 mg 2 tab and he could get thru with his work.    Pt also mentions he is still nasal congested. Second complain. Still congested since May.  Pt never uses Afrin. Some runny nose. He feel like has 24 hour cold.  Review of Systems Elbow pain    Objective:   Physical Exam  General- No acute distress. Pleasant patient. Left elbow- lateral epicondyle pain on palpation. Pain on pronation and supination.   Heent- nares patent. No pain on palpation of nose. No maxillary of sinus prssure.     Assessment & Plan:    It does appear that you have lateral epicondylitis.   Recommend using tennis elbow brace. Use daily. Can apply ice to area at night and massage area.   On review/discussion minimal sick time reported.  Will go ahead and refer you to sport med MD. Sometimes steroid injection can be given to relief symptoms.  Chronic nasal congestion since May. Feel like has cold. Possible allergic rhintinis. Will rx 6 day medrol dose course. No to fill diclofenac. Steroid may treat tendinitis type pain and may clear nasal congestin.  Also placed referral to ENT due to chronicity and history of nose fracture.   Follow up with me as needed if pain worsens before sport med referral or if significant appointment delay.    Mackie Pai, PA-C

## 2022-08-07 ENCOUNTER — Encounter: Payer: Self-pay | Admitting: Family Medicine

## 2022-08-07 ENCOUNTER — Ambulatory Visit: Payer: Self-pay

## 2022-08-07 ENCOUNTER — Ambulatory Visit: Payer: Commercial Managed Care - PPO | Admitting: Family Medicine

## 2022-08-07 VITALS — BP 116/70 | Ht 68.0 in | Wt 206.0 lb

## 2022-08-07 DIAGNOSIS — M7712 Lateral epicondylitis, left elbow: Secondary | ICD-10-CM

## 2022-08-07 MED ORDER — METHYLPREDNISOLONE ACETATE 40 MG/ML IJ SUSP
40.0000 mg | Freq: Once | INTRAMUSCULAR | Status: AC
  Administered 2022-08-07: 40 mg via INTRA_ARTICULAR

## 2022-08-07 NOTE — Patient Instructions (Signed)
Nice to meet you Please use ice as needed  Please try the exercises  Please try to adjust your motions in your work environment.   Please send me a message in MyChart with any questions or updates.  Please see me back in 4 weeks.   --Dr. Raeford Razor

## 2022-08-07 NOTE — Assessment & Plan Note (Signed)
Acutely occurring. Performs repetitive activities at work.  - counseled on home exercise therapy and supportive care - injection today  - could consider shockwave therapy, PT or nitro patches

## 2022-08-07 NOTE — Progress Notes (Signed)
  JAMIN PANTHER - 61 y.o. male MRN [id number]  Date of birth: [birth year]  SUBJECTIVE:  Including CC & ROS.  No chief complaint on file.   KEYANTE DURIO is a 61 y.o. male that is presenting with acute left elbow pain.  The pain has been ongoing for 3 weeks.  Has tried prednisone.  He does repetitive activity at work.  He is having a hard time picking things up or squeezing with his left hand.  Pain is occurring at the elbow.  No history of similar pain.  No history of surgery.   Review of Systems See HPI   HISTORY: Past Medical, Surgical, Social, and Family History Reviewed & Updated per EMR.   Pertinent Historical Findings include:  Past Medical History:  Diagnosis Date   Allergy    seasonal   Asthma    due to allergy in [place], treated x1   Hyperlipidemia    Psoriasis    Seizure disorder ([place])    last seizure at age 77.    Past Surgical History:  Procedure Laterality Date   COLONOSCOPY  2011   MAC   POLYPECTOMY       PHYSICAL EXAM:  VS: BP 116/70 (BP Location: Left Arm, Patient Position: Sitting)   Ht _0  (1.727 m)   Wt 206 lb (93.4 kg)   BMI 31.32 kg/m  Physical Exam Gen: NAD, alert, cooperative with exam, well-appearing MSK:  Neurovascularly intact    Limited ultrasound: Left elbow:  Spurring appreciated at the lateral epicondyle with mild increased hyperemia of the common extensor tendon.  Summary: Findings consistent with tennis elbow.  Ultrasound and interpretation by Clearance Coots, MD  Aspiration/Injection Procedure Note MERVIN RAMIRES [birth year]  Procedure: Injection Indications: Left elbow pain  Procedure Details Consent: Risks of procedure as well as the alternatives and risks of each were explained to the (patient/caregiver).  Consent for procedure obtained. Time Out: Verified patient identification, verified procedure, site/side was marked, verified correct patient position, special equipment/implants available,  medications/allergies/relevent history reviewed, required imaging and test results available.  Performed.  The area was cleaned with iodine and alcohol swabs.    The left lateral epicondyle was injected using 1 cc's of 40 mg Depo-Medrol and 2 cc's of 0.25% bupivacaine with a 25 1 1/2" needle.  Ultrasound was used. Images were obtained in long views showing the injection.     A sterile dressing was applied.  Patient did tolerate procedure well.     ASSESSMENT & PLAN:   Lateral epicondylitis of left elbow Acutely occurring. Performs repetitive activities at work.  - counseled on home exercise therapy and supportive care - injection today  - could consider shockwave therapy, PT or nitro patches

## 2022-09-04 ENCOUNTER — Ambulatory Visit: Payer: Commercial Managed Care - PPO | Admitting: Family Medicine

## 2022-09-04 VITALS — BP 120/60 | Ht 68.0 in | Wt 203.0 lb

## 2022-09-04 DIAGNOSIS — M7712 Lateral epicondylitis, left elbow: Secondary | ICD-10-CM

## 2022-09-04 NOTE — Assessment & Plan Note (Signed)
Doing well since the injection.  - counseled on home exercise therapy and supportive care - could consider physical therapy

## 2022-09-04 NOTE — Progress Notes (Signed)
  Jacob Morales - 61 y.o. male MRN [id number]  Date of birth: 1961/07/06  SUBJECTIVE:  Including CC & ROS.  No chief complaint on file.   Jacob Morales is a 61 y.o. male that is following up for his left elbow pain.  He has been doing well since the injection.  He only notices minor pain if he is doing a lot of work with his left arm.    Review of Systems See HPI   HISTORY: Past Medical, Surgical, Social, and Family History Reviewed & Updated per EMR.   Pertinent Historical Findings include:  Past Medical History:  Diagnosis Date   Allergy    seasonal   Asthma    due to allergy in [place], treated x1   Hyperlipidemia    Psoriasis    Seizure disorder ([place])    last seizure at age 54.    Past Surgical History:  Procedure Laterality Date   COLONOSCOPY  2011   MAC   POLYPECTOMY       PHYSICAL EXAM:  VS: BP 120/60   Ht _0  (1.727 m)   Wt 203 lb (92.1 kg)   BMI 30.87 kg/m  Physical Exam Gen: NAD, alert, cooperative with exam, well-appearing MSK:  Neurovascularly intact       ASSESSMENT & PLAN:   Lateral epicondylitis of left elbow Doing well since the injection.  - counseled on home exercise therapy and supportive care - could consider physical therapy

## 2022-10-23 ENCOUNTER — Other Ambulatory Visit (INDEPENDENT_AMBULATORY_CARE_PROVIDER_SITE_OTHER): Payer: Commercial Managed Care - PPO

## 2022-10-23 ENCOUNTER — Encounter: Payer: Self-pay | Admitting: Neurology

## 2022-10-23 ENCOUNTER — Ambulatory Visit: Payer: Commercial Managed Care - PPO | Admitting: Neurology

## 2022-10-23 VITALS — BP 131/84 | HR 78 | Ht 68.0 in | Wt 208.6 lb

## 2022-10-23 DIAGNOSIS — Z79899 Other long term (current) drug therapy: Secondary | ICD-10-CM

## 2022-10-23 DIAGNOSIS — G40B09 Juvenile myoclonic epilepsy, not intractable, without status epilepticus: Secondary | ICD-10-CM

## 2022-10-23 MED ORDER — PHENYTOIN SODIUM EXTENDED 100 MG PO CAPS: ORAL_CAPSULE | ORAL | 3 refills | Status: DC

## 2022-10-23 NOTE — Patient Instructions (Signed)
Always good to see you.  Continue Dilantin '100mg'$ : Take 5 capsules daily  2. Check vitamin D level  3. Let me know if you would like to proceed with EEG  4. Follow-up in 1 year, call for any changes   Seizure Precautions: 1. If medication has been prescribed for you to prevent seizures, take it exactly as directed.  Do not stop taking the medicine without talking to your doctor first, even if you have not had a seizure in a long time.   2. Avoid activities in which a seizure would cause danger to yourself or to others.  Don't operate dangerous machinery, swim alone, or climb in high or dangerous places, such as on ladders, roofs, or girders.  Do not drive unless your doctor says you may.  3. If you have any warning that you may have a seizure, lay down in a safe place where you can't hurt yourself.    4.  No driving for 6 months from last seizure, as per [facility].   Please refer to the following link on the [place] website for more information: [email or web address]/[email or web address]   5.  Maintain good sleep hygiene. Avoid alcohol.  6.  Contact your doctor if you have any problems that may be related to the medicine you are taking.  7.  Call 911 and bring the patient back to the ED if:        A.  The seizure lasts longer than 5 minutes.       B.  The patient doesn't awaken shortly after the seizure  C.  The patient has new problems such as difficulty seeing, speaking or moving  D.  The patient was injured during the seizure  E.  The patient has a temperature over 102 F (39C)  F.  The patient vomited and now is having trouble breathing

## 2022-10-23 NOTE — Progress Notes (Signed)
NEUROLOGY FOLLOW UP OFFICE NOTE  QUAN CYBULSKI [id number] 03/14/1961  HISTORY OF PRESENT ILLNESS: I had the pleasure of seeing Jacob Morales in follow-up in the neurology clinic on 10/23/2022.  The patient was last seen a year ago for well-controlled primary generalized epilepsy. He is alone in the office today. Records and images were personally reviewed where available. Since his last visit, he continues to do well seizure-free since age 61 on Dilantin '500mg'$  qhs without side effects. He denies any staring/unresponsive episodes, gaps in time, olfactory/gustatory hallucinations, focal numbness/tingling/weakness, myoclonic jerks. He has left elbow pain and sees Sports Medicine. He has some headaches related to one of his medications. No dizziness, vision changes, no falls. He gets 6 hours of sleep. Mood is normal. He lives with his fiancee and continues to work at [employer]. Bone density scan in 03/2021 was normal. Vitamin D level ordered previously but no results seen (per report it was cancelled/specimen unsuitable for testing due to lipemia).  Seizure History: This is a very pleasant 61 yo RH man with a history of seizures since age 61. He reports hitting the back of his head at age 61, there was no loss of consciousness at that time, then a few months later he had his first seizure described as twitching of the arms. This did not progress to a generalized tonic-clonic seizure. A few months later, he had 2 more episodes of body twitching, one time he fell due to leg twitching. He was diagnosed with seizures at that time and started on Dilantin. His first generalized tonic-clonic seizure occurred at age 53, in the setting of stopping his medication. His mother had described a blank stare and then a generalized convulsion. He has had 3 GTCs in his lifetime, provoked by sleep deprivation and missing medications. The last GTC was at age 61. He reports that if he would miss Dilantin for a week, he  would feel the twitching coming on. The last time this happened was several years ago. He denies any staring or unresponsive episodes, gaps in time, olfactory or gustatory hallucinations, focal numbness, tingling, or weakness. He has been taking Dilantin 500 mg at bedtime for many years.  He currently works as an Insurance underwriter.   Epilepsy Risk Factors: He had a normal birth and early development. There is no history of febrile convulsions, CNS infections such as meningitis/encephalitis, significant traumatic brain injury, neurosurgical procedures, or family history of seizures.   Prior AEDs: None except Dilantin  Diagnostic Data: Last EEG and MRI were more than 10 years ago in [place], he vaguely recalls her the EEG as being abnormal.   PAST MEDICAL HISTORY: Past Medical History:  Diagnosis Date   Allergy    seasonal   Asthma    due to allergy in [place], treated x1   Hyperlipidemia    Psoriasis    Seizure disorder ([facility])    last seizure at age 61.    MEDICATIONS: Current Outpatient Medications on File Prior to Visit  Medication Sig Dispense Refill   acetaminophen (TYLENOL) 325 MG tablet Take 650 mg by mouth every 6 (six) hours as needed.     albuterol (VENTOLIN HFA) 108 (90 Base) MCG/ACT inhaler Inhale 2 puffs into the lungs every 6 (six) hours as needed for wheezing or shortness of breath.     icosapent Ethyl (VASCEPA) 1 g capsule Take 2 capsules (2 g total) by mouth 2 (two) times daily. 120 capsule 11   levocetirizine (XYZAL) 5  MG tablet TAKE 1 TABLET BY MOUTH EVERY DAY IN THE EVENING 90 tablet 1   phenytoin (DILANTIN) 100 MG ER capsule Take 5 capsules every night 450 capsule 3   Risankizumab-rzaa,150 MG Dose, (SKYRIZI, 150 MG DOSE,) 75 MG/0.83ML PSKT      No current facility-administered medications on file prior to visit.    ALLERGIES: Allergies  Allergen Reactions   Meloxicam Rash   Tricor [Fenofibrate] Rash    FAMILY HISTORY: Family History  Problem  Relation Age of Onset   Alcohol abuse Father    Cancer Maternal Grandfather    Colon cancer Neg Hx    Colon polyps Neg Hx    Esophageal cancer Neg Hx    Rectal cancer Neg Hx    Stomach cancer Neg Hx     SOCIAL HISTORY: Social History   Socioeconomic History   Marital status: Single    Spouse name: Not on file   Number of children: Not on file   Years of education: Not on file   Highest education level: Not on file  Occupational History   Not on file  Tobacco Use   Smoking status: Never   Smokeless tobacco: Never  Vaping Use   Vaping Use: Never used  Substance and Sexual Activity   Alcohol use: Yes    Alcohol/week: 1.0 standard drink of alcohol    Types: 1 Cans of beer per week    Comment: occasional   Drug use: No   Sexual activity: Yes    Birth control/protection: Condom  Other Topics Concern   Not on file  Social History Narrative   Right handed      Highest level of edu - ADN      Lives in one story home   Social Determinants of Health   Financial Resource Strain: Not on file  Food Insecurity: Not on file  Transportation Needs: Not on file  Physical Activity: Not on file  Stress: Not on file  Social Connections: Not on file  Intimate Partner Violence: Not on file     PHYSICAL EXAM: Vitals:   10/23/22 0826  BP: 131/84  Pulse: 78  SpO2: 97%   General: No acute distress Head:  Normocephalic/atraumatic Skin/Extremities: No rash, no edema Neurological Exam: alert and awake. No aphasia or dysarthria. Fund of knowledge is appropriate.  Attention and concentration are normal.   Cranial nerves: Pupils equal, round. Extraocular movements intact with no nystagmus. Visual fields full.  No facial asymmetry.  Motor: Bulk and tone normal, muscle strength 5/5 throughout with no pronator drift.   Finger to nose testing intact.  Gait narrow-based and steady, able to tandem walk adequately.  Romberg negative.   IMPRESSION: This is a very pleasant 61 yo RH man with  a history of seizures since age 61. By description, seizures are suggestive of a primary generalized epilepsy with myoclonic jerks and generalized tonic-clonic seizures. He denies any history of absence seizures. He remains seizure-free since age [age] on Dilantin '500mg'$  qhs, refills sent. He is wondering about long-term need for medication, we discussed that he has been seizure-free for almost 30 years, we can certainly do a 97-CBUL EEG and if normal, wean medication, understanding risks of breakthrough seizure with any medication adjustment. He will think about it. Bone density scan in 03/2021 normal, continue to monitor bone health every 3 years, check vitamin D level today. He is aware of [place] driving laws to stop driving after a seizure until 6 months seizure-free. Follow-up in 1  year, call for any changes.   Thank you for allowing me to participate in his care.  Please do not hesitate to call for any questions or concerns.    Ellouise Newer, M.D.   CC: Mackie Pai, PA-C

## 2022-10-24 LAB — VITAMIN D 25 HYDROXY (VIT D DEFICIENCY, FRACTURES): VITD: 12.6 ng/mL — ABNORMAL LOW (ref 30.00–100.00)

## 2022-10-24 MED ORDER — VITAMIN D (ERGOCALCIFEROL) 1.25 MG (50000 UNIT) PO CAPS: ORAL_CAPSULE | ORAL | 0 refills | Status: DC

## 2023-01-01 ENCOUNTER — Other Ambulatory Visit: Payer: Self-pay | Admitting: Medical

## 2023-02-09 ENCOUNTER — Encounter: Payer: Self-pay | Admitting: *Deleted

## 2023-10-26 ENCOUNTER — Ambulatory Visit: Payer: Commercial Managed Care - PPO | Admitting: Neurology

## 2023-10-26 ENCOUNTER — Other Ambulatory Visit: Payer: Commercial Managed Care - PPO

## 2023-10-26 ENCOUNTER — Encounter: Payer: Self-pay | Admitting: Neurology

## 2023-10-26 VITALS — BP 131/82 | HR 74 | Ht 68.0 in | Wt 213.2 lb

## 2023-10-26 DIAGNOSIS — E559 Vitamin D deficiency, unspecified: Secondary | ICD-10-CM | POA: Diagnosis not present

## 2023-10-26 DIAGNOSIS — Z79899 Other long term (current) drug therapy: Secondary | ICD-10-CM

## 2023-10-26 DIAGNOSIS — G40B09 Juvenile myoclonic epilepsy, not intractable, without status epilepticus: Secondary | ICD-10-CM

## 2023-10-26 MED ORDER — PHENYTOIN SODIUM EXTENDED 100 MG PO CAPS: ORAL_CAPSULE | ORAL | 4 refills | Status: DC

## 2023-10-26 NOTE — Patient Instructions (Signed)
Always a pleasure to see you!  Have bloodwork done for vitamin D level. If still low, we will send in a prescription for high dose vitamin D to take once a week for 8 weeks, then after restart the daily vitamin D prescribed by your PCP  2. Schedule bone density scan for June 2025  3. Continue Dilantin 100mg : take 5 capsules every night  4. Follow-up in 1 year, call for any changes   Seizure Precautions: 1. If medication has been prescribed for you to prevent seizures, take it exactly as directed.  Do not stop taking the medicine without talking to your doctor first, even if you have not had a seizure in a long time.   2. Avoid activities in which a seizure would cause danger to yourself or to others.  Don't operate dangerous machinery, swim alone, or climb in high or dangerous places, such as on ladders, roofs, or girders.  Do not drive unless your doctor says you may.  3. If you have any warning that you may have a seizure, lay down in a safe place where you can't hurt yourself.    4.  No driving for 6 months from last seizure, as per [facility].   Please refer to the following link on the Epilepsy Foundation of [employer]'s website for more information: http://www.epilepsyfoundation.org/answerplace/Social/driving/drivingu.cfm   5.  Maintain good sleep hygiene. Avoid alcohol.  6.  Contact your doctor if you have any problems that may be related to the medicine you are taking.  7.  Call 911 and bring the patient back to the ED if:        A.  The seizure lasts longer than 5 minutes.       B.  The patient doesn't awaken shortly after the seizure  C.  The patient has new problems such as difficulty seeing, speaking or moving  D.  The patient was injured during the seizure  E.  The patient has a temperature over 102 F (39C)  F.  The patient vomited and now is having trouble breathing

## 2023-10-26 NOTE — Progress Notes (Signed)
NEUROLOGY FOLLOW UP OFFICE NOTE  TRAVIS SALER [id number] [birth year]  HISTORY OF PRESENT ILLNESS: I had the pleasure of seeing Jacob Morales in follow-up in the neurology clinic on 10/26/2023.  The patient was last seen a year ago for well-controlled primary generalized epilepsy. He is alone in the office today. Records and images were personally reviewed where available.  Since his last visit, he continues to do well seizure-free since age 62 on Dilantin 500mg  at bedtime without side effects. He denies any staring/unresponsive episodes, gaps in time, olfactory/gustatory hallucinations, focal numbness/tingling/weakness, myoclonic jerks. He has some headaches at times from the [place]. He denies any dizziness, vision changes, no falls. He gets 6 hours of sleep. Mood is good. He works at [employer] His last vitamin D level in 2023 was low (12.6). He took high dose vitamin D replacement for 8 weeks. He reports he was taking a prescribed vitamin D supplement from his PCP but would miss doses at that time, he still has a bottle of unused supplements.  His last bone density scan in 2022 was normal.   Seizure History: This is a very pleasant 62 yo RH man with a history of seizures since age 65. He reports hitting the back of his head at age 41, there was no loss of consciousness at that time, then a few months later he had his first seizure described as twitching of the arms. This did not progress to a generalized tonic-clonic seizure. A few months later, he had 2 more episodes of body twitching, one time he fell due to leg twitching. He was diagnosed with seizures at that time and started on Dilantin. His first generalized tonic-clonic seizure occurred at age 46, in the setting of stopping his medication. His mother had described a blank stare and then a generalized convulsion. He has had 3 GTCs in his lifetime, provoked by sleep deprivation and missing medications. The last GTC was at age 38. He  reports that if he would miss Dilantin for a week, he would feel the twitching coming on. The last time this happened was several years ago. He denies any staring or unresponsive episodes, gaps in time, olfactory or gustatory hallucinations, focal numbness, tingling, or weakness. He has been taking Dilantin 500 mg at bedtime for many years.  He currently works as an Patent attorney.   Epilepsy Risk Factors: He had a normal birth and early development. There is no history of febrile convulsions, CNS infections such as meningitis/encephalitis, significant traumatic brain injury, neurosurgical procedures, or family history of seizures.   Prior AEDs: None except Dilantin  Diagnostic Data: Last EEG and MRI were more than 10 years ago in [place], he vaguely recalls her the EEG as being abnormal.    PAST MEDICAL HISTORY: Past Medical History:  Diagnosis Date   Allergy    seasonal   Asthma    due to allergy in [place], treated x1   Hyperlipidemia    Psoriasis    Seizure disorder ([facility])    last seizure at age 41.    MEDICATIONS: Current Outpatient Medications on File Prior to Visit  Medication Sig Dispense Refill   acetaminophen (TYLENOL) 325 MG tablet Take 650 mg by mouth every 6 (six) hours as needed.     albuterol (VENTOLIN HFA) 108 (90 Base) MCG/ACT inhaler Inhale 2 puffs into the lungs every 6 (six) hours as needed for wheezing or shortness of breath.     levocetirizine (XYZAL) 5 MG tablet TAKE  1 TABLET BY MOUTH EVERY DAY IN THE EVENING 90 tablet 1   phenytoin (DILANTIN) 100 MG ER capsule Take 5 capsules every night 450 capsule 3   Risankizumab-rzaa,150 MG Dose, (SKYRIZI, 150 MG DOSE,) 75 MG/0.83ML PSKT      No current facility-administered medications on file prior to visit.    ALLERGIES: Allergies  Allergen Reactions   Meloxicam Rash   Tricor [Fenofibrate] Rash    FAMILY HISTORY: Family History  Problem Relation Age of Onset   Alcohol abuse Father    Cancer  Maternal Grandfather    Colon cancer Neg Hx    Colon polyps Neg Hx    Esophageal cancer Neg Hx    Rectal cancer Neg Hx    Stomach cancer Neg Hx     SOCIAL HISTORY: Social History   Socioeconomic History   Marital status: Single    Spouse name: Not on file   Number of children: Not on file   Years of education: Not on file   Highest education level: Not on file  Occupational History   Not on file  Tobacco Use   Smoking status: Never   Smokeless tobacco: Never  Vaping Use   Vaping status: Never Used  Substance and Sexual Activity   Alcohol use: Yes    Alcohol/week: 1.0 standard drink of alcohol    Types: 1 Cans of beer per week    Comment: occasional   Drug use: No   Sexual activity: Yes    Birth control/protection: Condom  Other Topics Concern   Not on file  Social History Narrative   Right handed      Highest level of edu - ADN      Lives in one story home   Social Drivers of Health   Financial Resource Strain: Not on file  Food Insecurity: Not on file  Transportation Needs: Not on file  Physical Activity: Not on file  Stress: Not on file  Social Connections: Not on file  Intimate Partner Violence: Not on file     PHYSICAL EXAM: Vitals:   10/26/23 0820  BP: 131/82  Pulse: 74  SpO2: 97%   General: No acute distress Head:  Normocephalic/atraumatic Skin/Extremities: No rash, no edema Neurological Exam: alert and awake. No aphasia or dysarthria. Fund of knowledge is appropriate.  Attention and concentration are normal.   Cranial nerves: Pupils equal, round. Extraocular movements intact with no nystagmus. Visual fields full.  No facial asymmetry.  Motor: Bulk and tone normal, muscle strength 5/5 throughout with no pronator drift.   Finger to nose testing intact.  Gait narrow-based and steady, able to tandem walk adequately.  Romberg negative.   IMPRESSION: This is a very pleasant 62 yo RH man with a history of seizures since age 77. By description,  seizures are suggestive of a primary generalized epilepsy with myoclonic jerks and generalized tonic-clonic seizures. He denies any history of absence seizures. He has been seizure-free since age 57 on Dilantin 500mg  at bedtime, refills sent. We again discussed monitoring bone health and vitamin D levels with long term Dilantin use, check vitamin D level. If still low, replacement will be sent for 8 weeks, then instructed to take daily vitamin D as prescribed by PCP. He is due for bone density scan in June 2025. He is aware of [place] driving laws to stop driving after a seizure until 6 months seizure-free. Follow-up in 1 year, call for any changes.    Thank you for allowing me to participate  in his care.  Please do not hesitate to call for any questions or concerns.    Patrcia Dolly, M.D.   CC: Esperanza Richters, PA-C

## 2023-10-27 LAB — VITAMIN D 25 HYDROXY (VIT D DEFICIENCY, FRACTURES)

## 2023-10-30 ENCOUNTER — Telehealth: Payer: Self-pay

## 2023-10-30 NOTE — Telephone Encounter (Signed)
-----   Message from Darice CHRISTELLA Shivers sent at 10/30/2023  9:02 AM EST ----- Pls let him know that vitamin D  level could not be done because his triglyceride levels are very high, the sample could not be analyzed. He needs to follow-up with his PCP for this, is it still Jacob Morales? Pls have him call for appt, thanks

## 2023-10-30 NOTE — Telephone Encounter (Signed)
 Pt called an informed  vitamin D  level could not be done because his triglyceride levels are very high, the sample could not be analyzed. He needs to follow-up with his PCP for this, is it still Jacob Morales? Yes PCP is Dr Dorina he is going to call for an appointment

## 2023-11-20 ENCOUNTER — Ambulatory Visit: Payer: Commercial Managed Care - PPO | Admitting: Medical

## 2023-11-23 ENCOUNTER — Ambulatory Visit: Payer: Commercial Managed Care - PPO | Admitting: Medical

## 2023-11-23 VITALS — BP 120/80 | HR 72 | Temp 98.2°F | Resp 12 | Ht 68.0 in | Wt 212.4 lb

## 2023-11-23 DIAGNOSIS — E559 Vitamin D deficiency, unspecified: Secondary | ICD-10-CM | POA: Diagnosis not present

## 2023-11-23 DIAGNOSIS — E785 Hyperlipidemia, unspecified: Secondary | ICD-10-CM

## 2023-11-23 DIAGNOSIS — R739 Hyperglycemia, unspecified: Secondary | ICD-10-CM | POA: Diagnosis not present

## 2023-11-23 MED ORDER — ROSUVASTATIN CALCIUM 20 MG PO TABS: 20.0000 mg | ORAL_TABLET | Freq: Every day | ORAL | 3 refills | Status: DC

## 2023-11-23 MED ORDER — ROSUVASTATIN CALCIUM 10 MG PO TABS: 10.0000 mg | ORAL_TABLET | Freq: Every day | ORAL | 3 refills | Status: DC

## 2023-11-23 NOTE — Patient Instructions (Addendum)
Hyperlipidemia Elevated cholesterol and triglycerides with a 10-year cardiovascular risk score of 17.9%. Previously prescribed Crestor and fenofibrate. Patient reported a rash with fenofibrate. Discussed the benefits and risks of statin therapy, including potential side effects such as myalgias and rhabdomyolysis. -Start Crestor 20mg  daily. -Repeat lipid panel fasting in labs this friday and again in 9 weeks. -Consider future reduction to Crestor 20mg  on Monday, Wednesday, and Friday if numbers improve and 10-year cardiovascular risk score decreases to less than 7.5%.(since you ask on potential decrease in future)  Impaired Glucose Tolerance Elevated blood sugar noted in previous labs. -Order HbA1c to assess average blood glucose levels over the past 3 months.  Vitamin D Deficiency History of low vitamin D levels. -Repeat Vitamin D level in 9 weeks.  Follow-up Based on lab results, will advise on when to schedule an in-person follow-up appointment.

## 2023-11-23 NOTE — Progress Notes (Addendum)
Subjective:    Patient ID: Jacob Morales, male    DOB: [birth year], 63 y.o.   MRN: [id number]  HPI Discussed the use of AI scribe software for clinical note transcription with the patient, who gave verbal consent to proceed.  History of Present Illness   The patient, with a history of high cholesterol and elevated blood sugar, was last evaluated in March of the previous year. At that time, his ten-year cardiovascular risk score was calculated to be 17.9. He never  prescribed Crestor and fenofibrate. He is on Skyrizi (every four months) for psoriasis and Dilantin for seizure management. The patient expressed concern about the number of medications he is on, likening himself to a pharmacy.  The patient inquired about the effectiveness and potential side effects of the cholesterol medication.  The patient expressed a desire to reduce his cholesterol and triglyceride levels through exercise, as he had done in the past. However, he admitted that he has not been exercising recently. On discussion willing to start med for cholesterol today.  The patient also reported a history of low vitamin D levels.       Past Medical History:  Diagnosis Date   Allergy    seasonal   Asthma    due to allergy in [place], treated x1   Hyperlipidemia    Psoriasis    Seizure disorder (HCC)    last seizure at age 67.     Social History   Socioeconomic History   Marital status: Single    Spouse name: Not on file   Number of children: Not on file   Years of education: Not on file   Highest education level: Associate degree: occupational, Scientist, product/process development, or vocational program  Occupational History   Not on file  Tobacco Use   Smoking status: Never   Smokeless tobacco: Never  Vaping Use   Vaping status: Never Used  Substance and Sexual Activity   Alcohol use: Yes    Alcohol/week: 1.0 standard drink of alcohol    Types: 1 Cans of beer per week    Comment: occasional   Drug use: No   Sexual  activity: Yes    Birth control/protection: Condom  Other Topics Concern   Not on file  Social History Narrative   Right handed      Highest level of edu - ADN      Lives in one story home   Social Drivers of Health   Financial Resource Strain: Low Risk  (11/22/2023)   Overall Financial Resource Strain (CARDIA)    Difficulty of Paying Living Expenses: Not very hard  Food Insecurity: No Food Insecurity (11/22/2023)   Hunger Vital Sign    Worried About Running Out of Food in the Last Year: Never true    Ran Out of Food in the Last Year: Never true  Transportation Needs: No Transportation Needs (11/22/2023)   PRAPARE - Administrator, Civil Service (Medical): No    Lack of Transportation (Non-Medical): No  Physical Activity: Sufficiently Active (11/22/2023)   Exercise Vital Sign    Days of Exercise per Week: 4 days    Minutes of Exercise per Session: 120 min  Stress: No Stress Concern Present (11/22/2023)   [employer] of Occupational Health - Occupational Stress Questionnaire    Feeling of Stress : Not at all  Social Connections: Moderately Isolated (11/22/2023)   Social Connection and Isolation Panel [NHANES]    Frequency of Communication with Friends and Family: Twice a  week    Frequency of Social Gatherings with Friends and Family: Once a week    Attends Religious Services: 1 to 4 times per year    Active Member of [employer] or Organizations: No    Attends Engineer, structural: Not on file    Marital Status: Divorced  Intimate Partner Violence: Not on file    Past Surgical History:  Procedure Laterality Date   COLONOSCOPY  2011   MAC   POLYPECTOMY      Family History  Problem Relation Age of Onset   Alcohol abuse Father    Cancer Maternal Grandfather    Colon cancer Neg Hx    Colon polyps Neg Hx    Esophageal cancer Neg Hx    Rectal cancer Neg Hx    Stomach cancer Neg Hx     Allergies  Allergen Reactions   Meloxicam Rash   Tricor  [Fenofibrate] Rash    Current Outpatient Medications on File Prior to Visit  Medication Sig Dispense Refill   acetaminophen (TYLENOL) 325 MG tablet Take 650 mg by mouth every 6 (six) hours as needed.     albuterol (VENTOLIN HFA) 108 (90 Base) MCG/ACT inhaler Inhale 2 puffs into the lungs every 6 (six) hours as needed for wheezing or shortness of breath.     phenytoin (DILANTIN) 100 MG ER capsule Take 5 capsules every night 450 capsule 4   Risankizumab-rzaa,150 MG Dose, (SKYRIZI, 150 MG DOSE,) 75 MG/0.83ML PSKT      No current facility-administered medications on file prior to visit.    BP 120/80 (BP Location: Right Arm, Cuff Size: Large)   Pulse 72   Temp 98.2 F (36.8 C) (Oral)   Resp 12   Ht 5\' 8"  (1.727 m)   Wt 212 lb 6.4 oz (96.3 kg)   SpO2 98%   BMI 32.30 kg/m         Review of Systems  Constitutional:  Negative for chills and fever.  HENT:  Negative for congestion and ear pain.   Respiratory:  Negative for cough, chest tightness and wheezing.   Gastrointestinal:  Negative for abdominal pain, blood in stool, constipation, diarrhea and vomiting.  Genitourinary:  Negative for dysuria and frequency.  Musculoskeletal:  Negative for back pain and myalgias.  Skin:  Negative for rash.  Neurological:  Negative for dizziness, speech difficulty and weakness.  Hematological:  Negative for adenopathy.  Psychiatric/Behavioral:  Negative for behavioral problems, dysphoric mood, sleep disturbance and suicidal ideas.     Past Medical History:  Diagnosis Date   Allergy    seasonal   Asthma    due to allergy in [place], treated x1   Hyperlipidemia    Psoriasis    Seizure disorder (HCC)    last seizure at age 83.     Social History   Socioeconomic History   Marital status: Single    Spouse name: Not on file   Number of children: Not on file   Years of education: Not on file   Highest education level: Associate degree: occupational, Scientist, product/process development, or vocational program   Occupational History   Not on file  Tobacco Use   Smoking status: Never   Smokeless tobacco: Never  Vaping Use   Vaping status: Never Used  Substance and Sexual Activity   Alcohol use: Yes    Alcohol/week: 1.0 standard drink of alcohol    Types: 1 Cans of beer per week    Comment: occasional   Drug use: No  Sexual activity: Yes    Birth control/protection: Condom  Other Topics Concern   Not on file  Social History Narrative   Right handed      Highest level of edu - ADN      Lives in one story home   Social Drivers of Health   Financial Resource Strain: Low Risk  (11/22/2023)   Overall Financial Resource Strain (CARDIA)    Difficulty of Paying Living Expenses: Not very hard  Food Insecurity: No Food Insecurity (11/22/2023)   Hunger Vital Sign    Worried About Running Out of Food in the Last Year: Never true    Ran Out of Food in the Last Year: Never true  Transportation Needs: No Transportation Needs (11/22/2023)   PRAPARE - Administrator, Civil Service (Medical): No    Lack of Transportation (Non-Medical): No  Physical Activity: Sufficiently Active (11/22/2023)   Exercise Vital Sign    Days of Exercise per Week: 4 days    Minutes of Exercise per Session: 120 min  Stress: No Stress Concern Present (11/22/2023)   [employer] of Occupational Health - Occupational Stress Questionnaire    Feeling of Stress : Not at all  Social Connections: Moderately Isolated (11/22/2023)   Social Connection and Isolation Panel [NHANES]    Frequency of Communication with Friends and Family: Twice a week    Frequency of Social Gatherings with Friends and Family: Once a week    Attends Religious Services: 1 to 4 times per year    Active Member of [employer] or Organizations: No    Attends Engineer, structural: Not on file    Marital Status: Divorced  Intimate Partner Violence: Not on file    Past Surgical History:  Procedure Laterality Date   COLONOSCOPY  2011    MAC   POLYPECTOMY      Family History  Problem Relation Age of Onset   Alcohol abuse Father    Cancer Maternal Grandfather    Colon cancer Neg Hx    Colon polyps Neg Hx    Esophageal cancer Neg Hx    Rectal cancer Neg Hx    Stomach cancer Neg Hx     Allergies  Allergen Reactions   Meloxicam Rash   Tricor [Fenofibrate] Rash    Current Outpatient Medications on File Prior to Visit  Medication Sig Dispense Refill   acetaminophen (TYLENOL) 325 MG tablet Take 650 mg by mouth every 6 (six) hours as needed.     albuterol (VENTOLIN HFA) 108 (90 Base) MCG/ACT inhaler Inhale 2 puffs into the lungs every 6 (six) hours as needed for wheezing or shortness of breath.     phenytoin (DILANTIN) 100 MG ER capsule Take 5 capsules every night 450 capsule 4   Risankizumab-rzaa,150 MG Dose, (SKYRIZI, 150 MG DOSE,) 75 MG/0.83ML PSKT      No current facility-administered medications on file prior to visit.    BP 120/80 (BP Location: Right Arm, Cuff Size: Large)   Pulse 72   Temp 98.2 F (36.8 C) (Oral)   Resp 12   Ht 5\' 8"  (1.727 m)   Wt 212 lb 6.4 oz (96.3 kg)   SpO2 98%   BMI 32.30 kg/m        Objective:   Physical Exam  General Mental Status- Alert. General Appearance- Not in acute distress.   Skin General: Color- Normal Color. Moisture- Normal Moisture.  Neck Carotid Arteries- Normal color. Moisture- Normal Moisture. No carotid bruits. No JVD.  Chest and Lung Exam Auscultation: Breath Sounds:-CTA  Cardiovascular Auscultation:Rythm- RRR Murmurs & Other Heart Sounds:Auscultation of the heart reveals- No Murmurs.  Abdomen Inspection:-Inspeection Normal. Palpation/Percussion:Note:No mass. Palpation and Percussion of the abdomen reveal- Non Tender, Non Distended + BS, no rebound or guarding.   Neurologic Cranial Nerve exam:- CN III-XII intact(No nystagmus), symmetric smile. Strength:- 5/5 equal and symmetric strength both upper and lower extremities.       Assessment  & Plan:   Assessment and Plan    Patient Instructions  Hyperlipidemia Elevated cholesterol and triglycerides with a 10-year cardiovascular risk score of 17.9%. Previously prescribed Crestor and fenofibrate. Patient reported a rash with fenofibrate. Discussed the benefits and risks of statin therapy, including potential side effects such as myalgias and rhabdomyolysis. -Start Crestor 20mg  daily. -Repeat lipid panel fasting in labs this friday and again in 9 weeks. -Consider future reduction to Crestor 20mg  on Monday, Wednesday, and Friday if numbers improve and 10-year cardiovascular risk score decreases to less than 7.5%.(since you ask on potential decrease in future)  Impaired Glucose Tolerance Elevated blood sugar noted in previous labs. -Order HbA1c to assess average blood glucose levels over the past 3 months.  Vitamin D Deficiency History of low vitamin D levels. -Repeat Vitamin D level in 9 weeks.  Follow-up Based on lab results, will advise on when to schedule an in-person follow-up appointment.        Esperanza Richters, PA-C

## 2023-11-27 ENCOUNTER — Other Ambulatory Visit (INDEPENDENT_AMBULATORY_CARE_PROVIDER_SITE_OTHER): Payer: Commercial Managed Care - PPO

## 2023-11-27 ENCOUNTER — Telehealth: Payer: Self-pay | Admitting: *Deleted

## 2023-11-27 ENCOUNTER — Ambulatory Visit: Payer: Commercial Managed Care - PPO | Admitting: Medical

## 2023-11-27 ENCOUNTER — Other Ambulatory Visit: Payer: Commercial Managed Care - PPO

## 2023-11-27 DIAGNOSIS — R739 Hyperglycemia, unspecified: Secondary | ICD-10-CM

## 2023-11-27 DIAGNOSIS — E785 Hyperlipidemia, unspecified: Secondary | ICD-10-CM | POA: Diagnosis not present

## 2023-11-27 LAB — COMPREHENSIVE METABOLIC PANEL
ALT: 26 U/L (ref 0–53)
AST: 21 U/L (ref 0–37)
Albumin: 4.5 g/dL (ref 3.5–5.2)
Alkaline Phosphatase: 83 U/L (ref 39–117)
BUN: 15 mg/dL (ref 6–23)
CO2: 29 meq/L (ref 19–32)
Calcium: 9.1 mg/dL (ref 8.4–10.5)
Chloride: 101 meq/L (ref 96–112)
Creatinine, Ser: 0.82 mg/dL (ref 0.40–1.50)
GFR: 92.72 mL/min (ref >60.00)
Glucose, Bld: 139 mg/dL — ABNORMAL HIGH (ref 70–99)
Potassium: 4.5 meq/L (ref 3.5–5.1)
Sodium: 138 meq/L (ref 135–145)
Total Bilirubin: 0.5 mg/dL (ref 0.2–1.2)
Total Protein: 7 g/dL (ref 6.0–8.3)

## 2023-11-27 LAB — LIPID PANEL
Cholesterol: 213 mg/dL — ABNORMAL HIGH (ref 0–200)
HDL: 34.1 mg/dL — ABNORMAL LOW (ref >39.00)
NonHDL: 179.1
Total CHOL/HDL Ratio: 6
Triglycerides: 412 mg/dL — ABNORMAL HIGH (ref 0.0–149.0)
VLDL: 82.4 mg/dL — ABNORMAL HIGH (ref 0.0–40.0)

## 2023-11-27 LAB — LDL CHOLESTEROL, DIRECT: Direct LDL: 103 mg/dL

## 2023-11-27 LAB — HEMOGLOBIN A1C: Hgb A1c MFr Bld: 7.5 % — ABNORMAL HIGH (ref 4.6–6.5)

## 2023-11-27 NOTE — Telephone Encounter (Signed)
Pt came in for lab appt this morning.  He states he went to pick up Crestor prescription and pharmacy had 2 Rx ready for 10mg  and 20mg .  Pt took the 10mg  Rx and declined the 20mg . He is asking for clarification of what dose he should be taking.  Advised pt per 1/27 office note that he should be taking 20mg  once daily. Advised pt he can take 2 of the 10mg   tablets to equal 20mg  daily until he finishes his current bottle then pick up the 20mg  Rx from the pharmacy.  Pt voices understanding and agreement.

## 2023-11-28 ENCOUNTER — Encounter: Payer: Self-pay | Admitting: Medical

## 2023-11-28 MED ORDER — METFORMIN HCL 500 MG PO TABS: 500.0000 mg | ORAL_TABLET | Freq: Two times a day (BID) | ORAL | 3 refills | Status: DC

## 2023-11-28 NOTE — Addendum Note (Signed)
Addended by: Gwenevere Abbot on: 11/28/2023 08:52 AM   Modules accepted: Orders

## 2023-12-01 MED ORDER — ROSUVASTATIN CALCIUM 20 MG PO TABS: 20.0000 mg | ORAL_TABLET | Freq: Every day | ORAL | 3 refills | Status: DC

## 2023-12-01 NOTE — Addendum Note (Signed)
Addended by: Gwenevere Abbot on: 12/01/2023 12:09 PM   Modules accepted: Orders

## 2023-12-29 IMAGING — DX DG NASAL BONES 3+V
6 series · 6 of 6 positions shown · non-contrast
Comparison: None Available.

CLINICAL DATA: Previous trauma, nasal congestion

EXAM:
NASAL BONES - 3+ VIEW

[[person_name]]
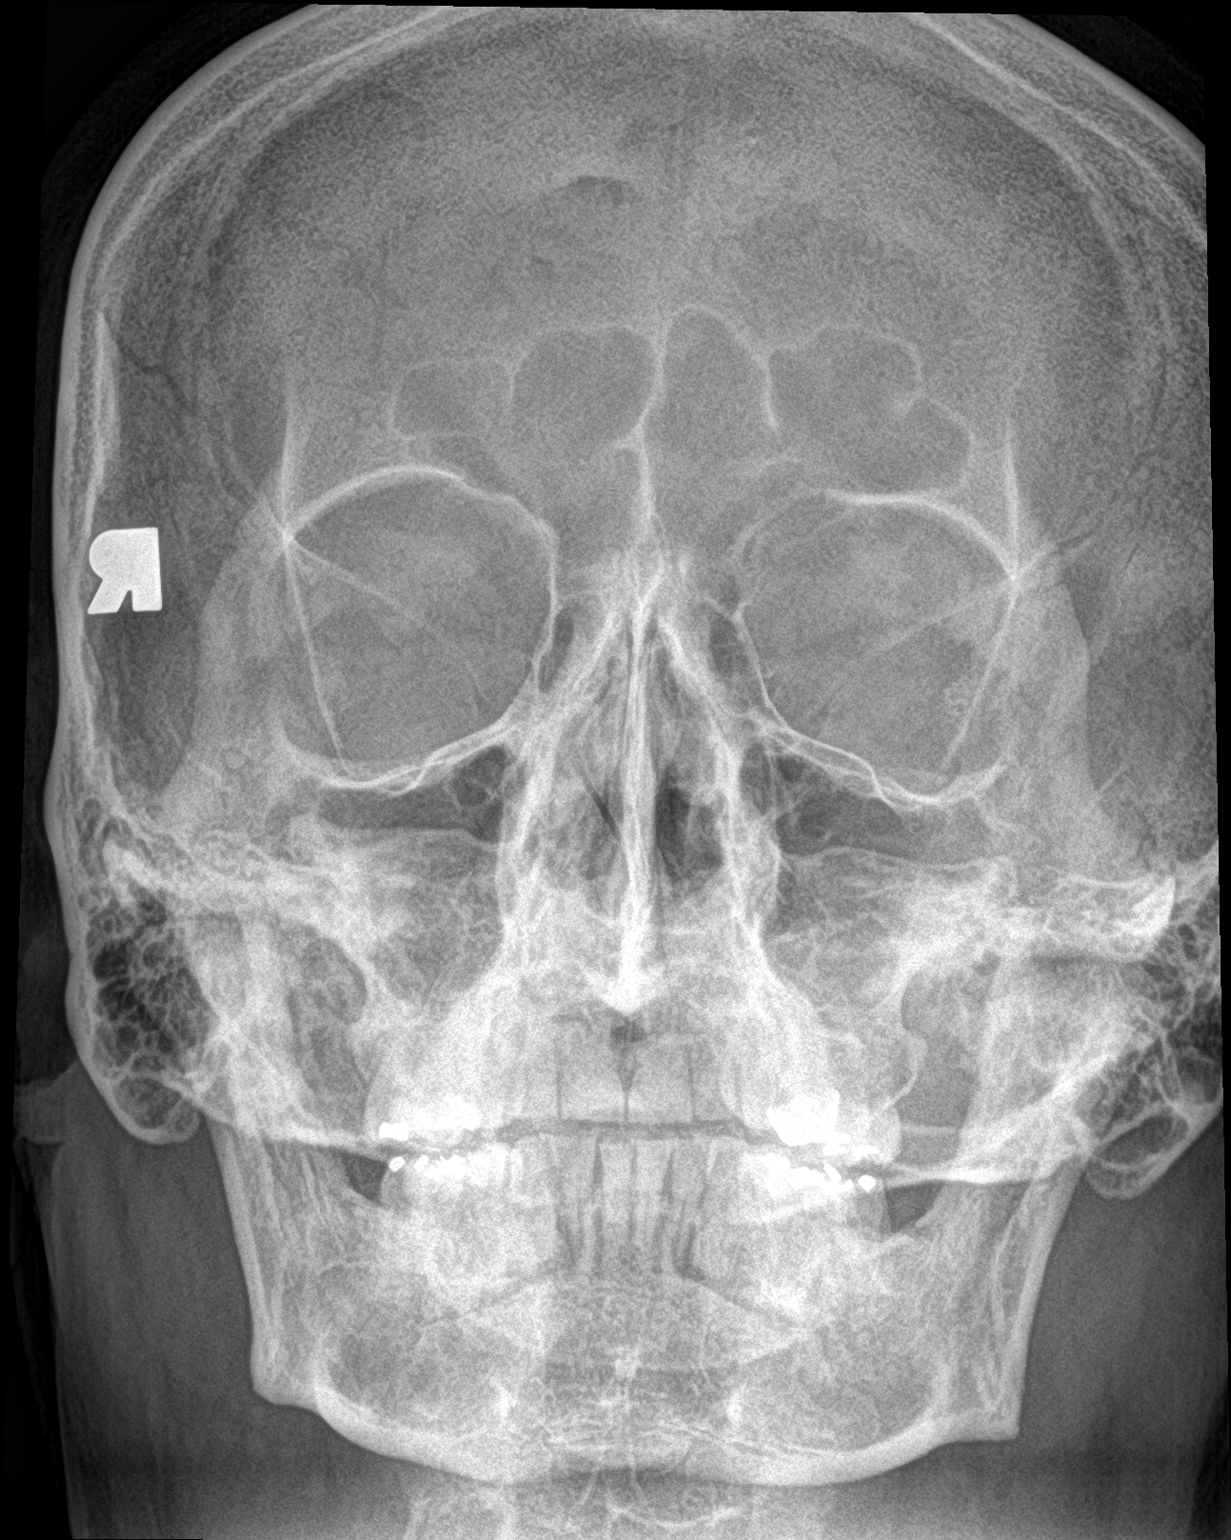

[nasal waters]
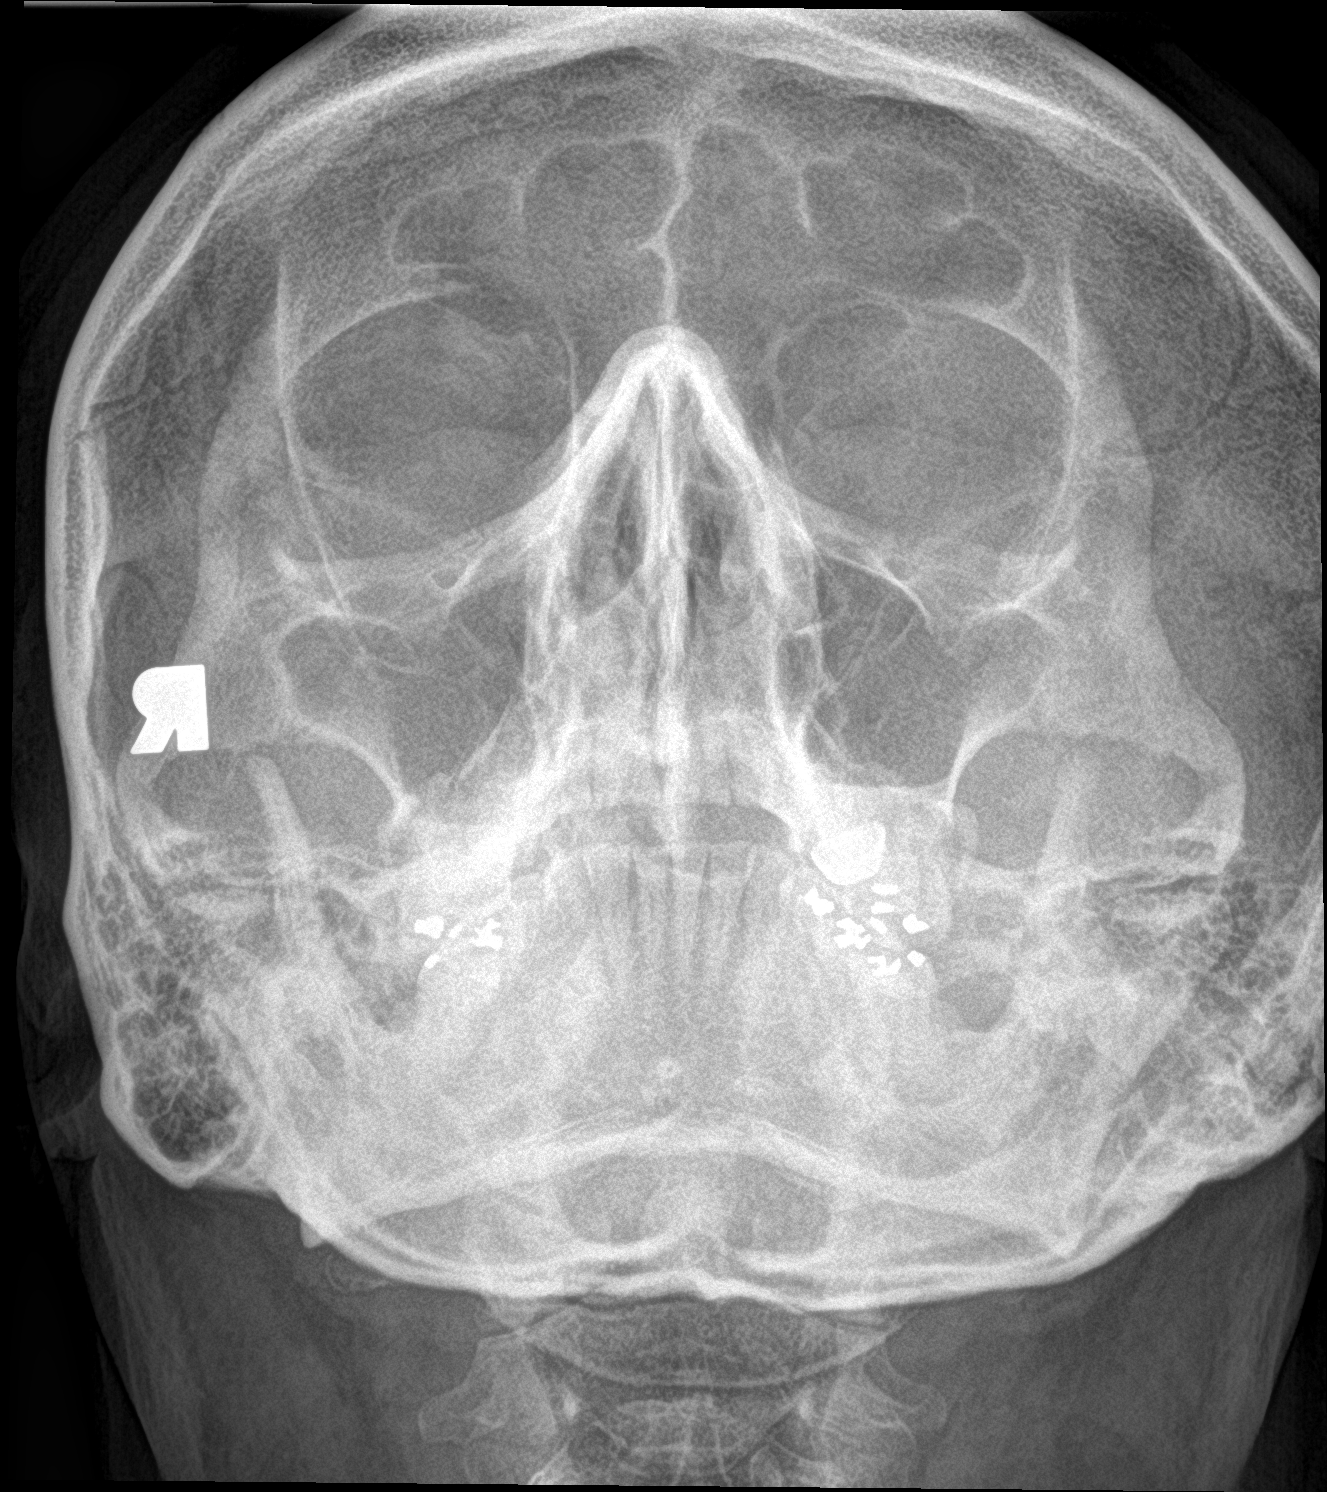

[nasal lat (1 of 4)]
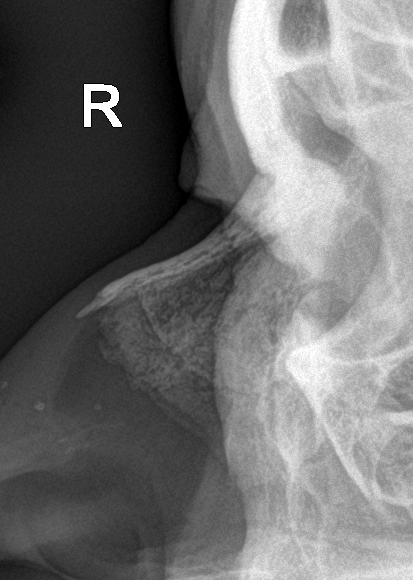

[nasal lat (2 of 4)]
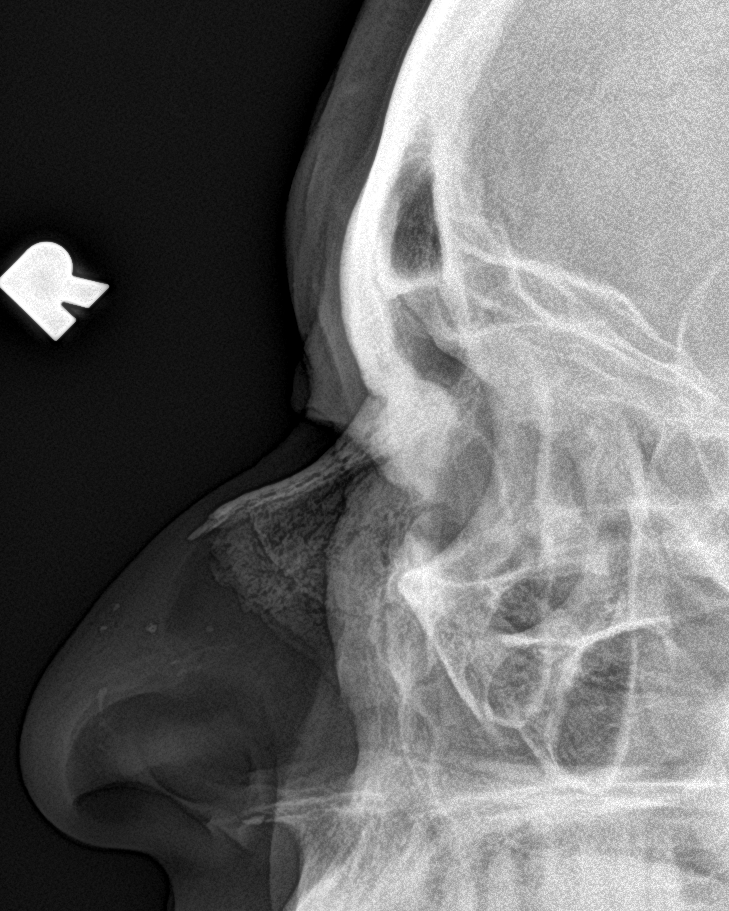

[nasal lat (3 of 4)]
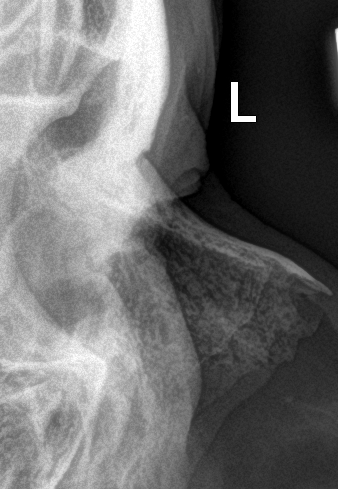

[nasal lat (4 of 4)]
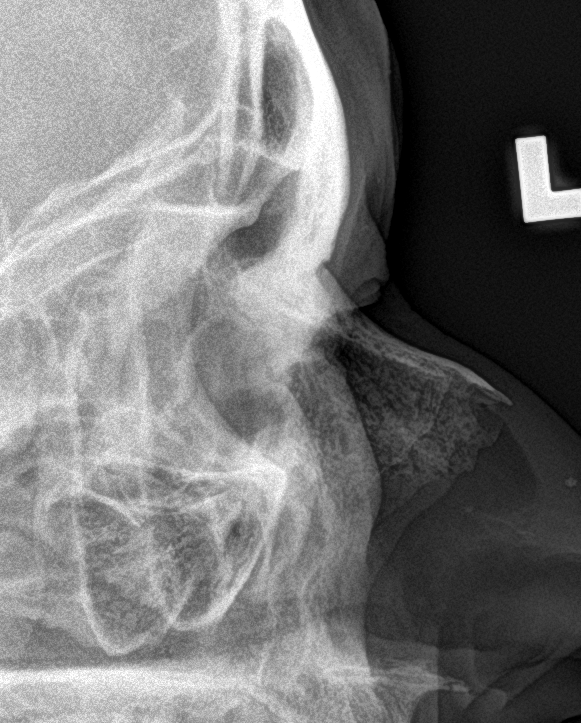

[6 of 6 positions shown; findings below may reference images not displayed]

FINDINGS: There is radiolucent line at the tip of anterior nasal spine of
maxilla. Rest of the bony structures are unremarkable. Visualized
paranasal sinuses are unremarkable.
IMPRESSION: Fracture is noted in the tip of anterior nasal spine of maxilla
which may be recent or old. No other significant abnormality is
seen.

## 2024-06-17 ENCOUNTER — Other Ambulatory Visit: Payer: Commercial Managed Care - PPO

## 2024-06-17 ENCOUNTER — Ambulatory Visit (HOSPITAL_COMMUNITY)
Admission: RE | Admit: 2024-06-17 | Discharge: 2024-06-17 | Disposition: A | Discharge status: Home / Self Care | Source: Ambulatory Visit | Attending: Neurology | Admitting: Neurology

## 2024-06-17 DIAGNOSIS — E559 Vitamin D deficiency, unspecified: Secondary | ICD-10-CM | POA: Diagnosis present

## 2024-06-17 DIAGNOSIS — G40B09 Juvenile myoclonic epilepsy, not intractable, without status epilepticus: Secondary | ICD-10-CM | POA: Insufficient documentation

## 2024-06-17 DIAGNOSIS — Z79899 Other long term (current) drug therapy: Secondary | ICD-10-CM | POA: Insufficient documentation

## 2024-06-21 ENCOUNTER — Ambulatory Visit: Payer: Self-pay | Admitting: Neurology

## 2024-08-02 ENCOUNTER — Ambulatory Visit: Admitting: Medical

## 2024-08-02 VITALS — BP 118/78 | HR 73 | Temp 98.4°F | Resp 14 | Ht 68.0 in | Wt 202.0 lb

## 2024-08-02 DIAGNOSIS — R0981 Nasal congestion: Secondary | ICD-10-CM

## 2024-08-02 DIAGNOSIS — H6690 Otitis media, unspecified, unspecified ear: Secondary | ICD-10-CM

## 2024-08-02 DIAGNOSIS — H9311 Tinnitus, right ear: Secondary | ICD-10-CM | POA: Diagnosis not present

## 2024-08-02 MED ORDER — AMOXICILLIN-POT CLAVULANATE 875-125 MG PO TABS: 1.0000 | ORAL_TABLET | Freq: Two times a day (BID) | ORAL | 0 refills | Status: DC

## 2024-08-02 MED ORDER — BENZONATATE 100 MG PO CAPS
100.0000 mg | ORAL_CAPSULE | Freq: Three times a day (TID) | ORAL | 0 refills | Status: AC | PRN
Start: 2024-08-02 — End: ?

## 2024-08-02 MED ORDER — FLUTICASONE PROPIONATE 50 MCG/ACT NA SUSP: 2.0000 | Freq: Every day | NASAL | 1 refills | Status: DC

## 2024-08-02 NOTE — Patient Instructions (Addendum)
 Patient Instructions  Acute bilateral otitis media (right worse than left) with right-sided tinnitus Acute bilateral otitis media with right-sided tinnitus. Right ear worse than left. Tinnitus likely due to infection and congestion. Augmentin preferred over azithromycin. - Prescribe antibiotic for 10 days. - Prescribe Flonase  nasal spray. - Provide tinnitus education sheet. - Schedule follow-up in 10 days. - Consider antibiotic injection and ENT referral if no improvement after two antibiotic courses.  Acute upper respiratory infection with residual cough and nasal congestion Residual cough and nasal congestion from acute upper respiratory infection. No prior nasal spray or cough medication. - Prescribe benzonatate every eight hours as needed.   Follow up in 10 days or sooner if needed   Tinnitus Tinnitus is when you hear a sound that there's no actual source for. It may sound like ringing in your ears or something else. The sound may be loud, soft, or somewhere in between. It can last for a few seconds or be constant for days. It can come and go. Almost everyone has tinnitus at some point. It's not the same as hearing loss. But you may need to see a health care provider if: It lasts for a long time. It comes back often. You have trouble sleeping and focusing. What are the causes? The cause of tinnitus is often unknown. In some cases, you may get it if: You're around loud noises, such as from machines or music. An object gets stuck in your ear. Earwax builds up in Landscape architect. You drink a lot of alcohol or caffeine. You take certain medicines. You start to lose your hearing. You may also get it from some medical conditions. These may include: Ear or sinus infections. Heart diseases. High blood pressure. Allergies. Mnire's disease. Problems with your thyroid . A tumor. This is a growth of cells that isn't normal. A weak, bulging blood vessel called an aneurysm near your ear. What  increases the risk? You may be more likely to get tinnitus if: You're around loud noises a lot. You're older. You drink alcohol. You smoke. What are the signs or symptoms? The main symptom is hearing a sound that there's no source for. It may sound like ringing. It may also sound like: Buzzing. Sizzling. Blowing air. Hissing. Whistling. Other sounds may include: Roaring. Running water. A musical note. Tapping. Humming. You may have symptoms in one ear or both ears. How is this diagnosed? Tinnitus is diagnosed based on your symptoms, your medical history, and an exam. Your provider may do a full hearing test if your tinnitus: Is in just one ear. Makes it hard for you to hear. Lasts 6 months or longer. You may also need to see an expert in hearing disorders called an audiologist. They may ask you about your symptoms and how tinnitus affects your daily life. You may have other tests done. These may include: A CT scan. An MRI. An angiogram. This shows how blood flows through your blood vessels. How is this treated? Treatment may include: Therapy to help you manage the stress of living with tinnitus. Finding ways to mask or cover the sound of tinnitus. These include: Sound or white noise machines. Devices that fit in your ear and play sounds or music. A loud humidifier. Acoustic neural stimulation. This is when you use headphones to listen to music that has a special signal in it. Over time, this signal may change some of the pathways in your brain. This can make you less sensitive to tinnitus. This treatment is used  for very severe cases. Using hearing aids or cochlear implants if your tinnitus is from hearing loss. If your tinnitus is caused by a medical condition, treating the condition may make it go away.  Follow these instructions at home: Managing symptoms     Try to avoid being in loud places or around loud noises. Wear earplugs or headphones when you're around loud  noises. Find ways to reduce stress. These may include meditation, yoga, or deep breathing. Sleep with your head slightly raised. General instructions Take over-the-counter and prescription medicines only as told by your provider. Track the things that cause symptoms (triggers). Try to avoid these things. To stop your tinnitus from getting worse: Do not drink alcohol. Do not have caffeine. Do not use any products that contain nicotine or tobacco. These products include cigarettes, chewing tobacco, and vaping devices, such as e-cigarettes. If you need help quitting, ask your provider. Avoid using too much salt. Get enough sleep each night. Where to find more information American Tinnitus Association: https://www.johnson-hamilton.org/ Contact a health care provider if: Your symptoms last for 3 weeks or longer without stopping. You have sudden hearing loss. Your symptoms get worse or don't get better with home care. You can't manage the stress of living with tinnitus. Get help right away if: You get tinnitus after a head injury. You have tinnitus and: Dizziness. Nausea and vomiting. Loss of balance. A sudden, severe headache. Changes to your eyesight. Weakness in your face, arms, or legs. These symptoms may be an emergency. Get help right away. Call 911. Do not wait to see if the symptoms will go away. Do not drive yourself to the hospital. This information is not intended to replace advice given to you by your health care provider. Make sure you discuss any questions you have with your health care provider. Document Revised: 01/19/2023 Document Reviewed: 01/19/2023 Elsevier Patient Education  2024 [employer].

## 2024-08-02 NOTE — Progress Notes (Signed)
 Subjective:    Patient ID: Jacob Morales, male    DOB: [birth year], 63 y.o.   MRN: [id number]  HPI Jacob Morales is a 63 year old male who presents with tinnitus following a recent ear infection.   Congestion and runnny nose about 5 days before he went to [facility] about 2 days ago. Tinnitus occurred after he was treated for OM.  He has been experiencing ringing in his right ear for over two weeks, which began shortly after completing a course of azithromycin prescribed for an ear infection. The ear infection was diagnosed at a quick care clinic following a [place]. Initial symptoms of the cold included nasal congestion, rhinorrhea, and sneezing, which led to clogged ears. The right ear was identified as infected, and he was treated with azithromycin. Despite completing the antibiotic course, the ringing in the right ear persists. He denies feeling pressure in the ear but notes that the ringing is continuous. He can hear people speak clearly despite the tinnitus.  He also has a residual cough that has been present for some time, though he is not currently taking any medication for it. He acknowledges ongoing nasal congestion and a feeling of being 'stuck a little bit'.    Review of Systems See hpi.    Objective:   Physical Exam  General Mental Status- Alert. General Appearance- Not in acute distress.   Skin General: Color- Normal Color. Moisture- Normal Moisture.  Neck Carotid Arteries- Normal color. Moisture- Normal Moisture. No carotid bruits. No JVD.  Chest and Lung Exam Auscultation: Breath Sounds:-CTA  Cardiovascular Auscultation:Rythm- RRR Murmurs & Other Heart Sounds:Auscultation of the heart reveals- No Murmurs.  Abdomen Inspection:-Inspeection Normal. Palpation/Percussion:Note:No mass. Palpation and Percussion of the abdomen reveal- Non Tender, Non Distended + BS, no rebound or  guarding.   Neurologic Cranial Nerve exam:- CN III-XII intact(No nystagmus), symmetric smile. Strength:- 5/5 equal and symmetric strength both upper and lower extremities.   Heent- no sinus pressure. Both tm moderate bright red. Rt side worse than left. No wax on either side.    Assessment & Plan:   Patient Instructions  Acute bilateral otitis media (right worse than left) with right-sided tinnitus Acute bilateral otitis media with right-sided tinnitus. Right ear worse than left. Tinnitus likely due to infection and congestion. Augmentin preferred over azithromycin. - Prescribe antibiotic for 10 days. - Prescribe Flonase  nasal spray. - Provide tinnitus education sheet. - Schedule follow-up in 10 days. - Consider antibiotic injection and ENT referral if no improvement after two antibiotic courses.  Acute upper respiratory infection with residual cough and nasal congestion Residual cough and nasal congestion from acute upper respiratory infection. No prior nasal spray or cough medication. - Prescribe benzonatate every eight hours as needed.   Follow up in 10 days or sooner if needed   Tinnitus Tinnitus is when you hear a sound that there's no actual source for. It may sound like ringing in your ears or something else. The sound may be loud, soft, or somewhere in between. It can last for a few seconds or be constant for days. It can come and go. Almost everyone has tinnitus at some point. It's not the same as hearing loss. But you may need to see a health care provider if: It lasts for a long time. It comes back often. You have trouble sleeping and focusing. What are the causes? The cause of tinnitus is often unknown. In some  cases, you may get it if: You're around loud noises, such as from machines or music. An object gets stuck in your ear. Earwax builds up in Landscape architect. You drink a lot of alcohol or caffeine. You take certain medicines. You start to lose your hearing. You may  also get it from some medical conditions. These may include: Ear or sinus infections. Heart diseases. High blood pressure. Allergies. Mnire's disease. Problems with your thyroid . A tumor. This is a growth of cells that isn't normal. A weak, bulging blood vessel called an aneurysm near your ear. What increases the risk? You may be more likely to get tinnitus if: You're around loud noises a lot. You're older. You drink alcohol. You smoke. What are the signs or symptoms? The main symptom is hearing a sound that there's no source for. It may sound like ringing. It may also sound like: Buzzing. Sizzling. Blowing air. Hissing. Whistling. Other sounds may include: Roaring. Running water. A musical note. Tapping. Humming. You may have symptoms in one ear or both ears. How is this diagnosed? Tinnitus is diagnosed based on your symptoms, your medical history, and an exam. Your provider may do a full hearing test if your tinnitus: Is in just one ear. Makes it hard for you to hear. Lasts 6 months or longer. You may also need to see an expert in hearing disorders called an audiologist. They may ask you about your symptoms and how tinnitus affects your daily life. You may have other tests done. These may include: A CT scan. An MRI. An angiogram. This shows how blood flows through your blood vessels. How is this treated? Treatment may include: Therapy to help you manage the stress of living with tinnitus. Finding ways to mask or cover the sound of tinnitus. These include: Sound or white noise machines. Devices that fit in your ear and play sounds or music. A loud humidifier. Acoustic neural stimulation. This is when you use headphones to listen to music that has a special signal in it. Over time, this signal may change some of the pathways in your brain. This can make you less sensitive to tinnitus. This treatment is used for very severe cases. Using hearing aids or cochlear implants  if your tinnitus is from hearing loss. If your tinnitus is caused by a medical condition, treating the condition may make it go away.  Follow these instructions at home: Managing symptoms     Try to avoid being in loud places or around loud noises. Wear earplugs or headphones when you're around loud noises. Find ways to reduce stress. These may include meditation, yoga, or deep breathing. Sleep with your head slightly raised. General instructions Take over-the-counter and prescription medicines only as told by your provider. Track the things that cause symptoms (triggers). Try to avoid these things. To stop your tinnitus from getting worse: Do not drink alcohol. Do not have caffeine. Do not use any products that contain nicotine or tobacco. These products include cigarettes, chewing tobacco, and vaping devices, such as e-cigarettes. If you need help quitting, ask your provider. Avoid using too much salt. Get enough sleep each night. Where to find more information American Tinnitus Association: https://www.johnson-hamilton.org/ Contact a health care provider if: Your symptoms last for 3 weeks or longer without stopping. You have sudden hearing loss. Your symptoms get worse or don't get better with home care. You can't manage the stress of living with tinnitus. Get help right away if: You get tinnitus after a head injury.  You have tinnitus and: Dizziness. Nausea and vomiting. Loss of balance. A sudden, severe headache. Changes to your eyesight. Weakness in your face, arms, or legs. These symptoms may be an emergency. Get help right away. Call 911. Do not wait to see if the symptoms will go away. Do not drive yourself to the hospital. This information is not intended to replace advice given to you by your health care provider. Make sure you discuss any questions you have with your health care provider. Document Revised: 01/19/2023 Document Reviewed: 01/19/2023 Elsevier Patient Education  2024 Elsevier  Inc.    Dallas Maxwell, PA-C

## 2024-08-04 ENCOUNTER — Encounter: Payer: Self-pay | Admitting: Medical

## 2024-08-04 ENCOUNTER — Ambulatory Visit: Payer: Self-pay

## 2024-08-04 MED ORDER — CEFDINIR 300 MG PO CAPS: 300.0000 mg | ORAL_CAPSULE | Freq: Two times a day (BID) | ORAL | 0 refills | Status: AC

## 2024-08-04 NOTE — Telephone Encounter (Signed)
 FYI Only or Action Required?: Action required by provider: clinical question for provider.  Patient was last seen in primary care on 08/02/2024 by Dorina Loving, PA-C.  Called Nurse Triage reporting Medication Reaction.  Symptoms began yesterday.  Interventions attempted: Nothing.  Symptoms are: stable.  Triage Disposition: Call PCP Within 24 Hours  Patient/caregiver understands and will follow disposition?: Yes   Pt prescribed augmentin and is vomiting after each dose.    Copied from CRM [id number]. Topic: Clinical - Red Word Triage >> Aug 04, 2024 12:08 PM Franky GRADE wrote: Red Word that prompted transfer to Nurse Triage: Patient was seen on Tuesday 08/02/2024 and prescribed amoxicillin-clavulanate (AUGMENTIN) 875-125 MG tablet [[id number]]; however, patient seems to be having a reaction to the medication as he vomiting constantly for the last two days. Reason for Disposition  Vomiting a prescription medication  Answer Assessment - Initial Assessment Questions Pt stated on atbx yesterday. After first dose he started vomiting and couldn't stop. Took first dose after eating. He did not take evening dose and no vomiting. Today he ate and took the atbx with his food and again started projectile vomiting per pts GF. No other symptoms just the vomiting after taking the medication.    1. VOMITING SEVERITY: How many times have you vomited in the past 24 hours?       2. ONSET: When did the vomiting begin?      yesterday 3. FLUIDS: What fluids or food have you vomited up today? Have you been able to keep any fluids down?     Able to keep fluids down  Protocols used: Vomiting-A-AH

## 2024-08-04 NOTE — Telephone Encounter (Signed)
 See my chart message

## 2024-08-04 NOTE — Telephone Encounter (Signed)
 Updated allergy list.

## 2024-08-04 NOTE — Addendum Note (Signed)
 Addended by: DORINA DALLAS HERO on: 08/04/2024 02:58 PM   Modules accepted: Orders

## 2024-08-12 ENCOUNTER — Ambulatory Visit: Admitting: Medical

## 2024-08-17 ENCOUNTER — Ambulatory Visit: Admitting: Medical

## 2024-08-17 VITALS — BP 124/82 | HR 66 | Temp 98.1°F | Resp 14 | Ht 68.0 in | Wt 199.6 lb

## 2024-08-17 DIAGNOSIS — H6690 Otitis media, unspecified, unspecified ear: Secondary | ICD-10-CM

## 2024-08-17 DIAGNOSIS — H9311 Tinnitus, right ear: Secondary | ICD-10-CM

## 2024-08-17 DIAGNOSIS — R0981 Nasal congestion: Secondary | ICD-10-CM | POA: Diagnosis not present

## 2024-08-17 MED ORDER — CEFDINIR 300 MG PO CAPS: 300.0000 mg | ORAL_CAPSULE | Freq: Two times a day (BID) | ORAL | 0 refills | Status: AC

## 2024-08-17 MED ORDER — CEFTRIAXONE SODIUM 1 G IJ SOLR
1.0000 g | Freq: Once | INTRAMUSCULAR | Status: AC
  Administered 2024-08-17: 1 g via INTRAMUSCULAR

## 2024-08-17 NOTE — Patient Instructions (Signed)
 Bilateral otitis media with right-sided tinnitus and nasal congestion Persistent bilateral otitis media with right-sided tinnitus and nasal congestion for over a month and a half. Previous treatments included azithromycin and Augmentin. Nasal congestion persists, but coughing has resolved. - Administer 1 gram injection of Rocephin. - Prescribe four more days of cefdinir. - Continue Flonase . - Refer to ENT specialist. - Instruct to call ENT in 7-10 days if no contact is made. - Advise to report worsening pain, tinnitus, or new symptoms.  Augmentin allergy Augmentin caused vomiting lasting two to two and a half hours. Cefdinir was tolerated without adverse effects. - List Augmentin as an allergy.  Follow up date to be determined after ENT referral or sooner if needed.

## 2024-08-17 NOTE — Progress Notes (Signed)
 Subjective:    Patient ID: Jacob Morales, male    DOB: Jan 24, 1961, 63 y.o.   MRN: [id number]  HPI  Last visit AVS below.   Acute bilateral otitis media (right worse than left) with right-sided tinnitus Acute bilateral otitis media with right-sided tinnitus. Right ear worse than left. Tinnitus likely due to infection and congestion. Augmentin preferred over azithromycin. - Prescribe antibiotic for 10 days. - Prescribe Flonase  nasal spray. - Provide tinnitus education sheet. - Schedule follow-up in 10 days. - Consider antibiotic injection and ENT referral if no improvement after two antibiotic courses.   Acute upper respiratory infection with residual cough and nasal congestion Residual cough and nasal congestion from acute upper respiratory infection. No prior nasal spray or cough medication. - Prescribe benzonatate every eight hours as needed    Jacob Morales is a 63 year old male who presents with persistent ringing in the right ear.  He has been experiencing persistent ringing in his right ear for approximately a month and a half. The ringing began a week after visiting urgent care, where he was initially treated with a Z-Pak. The ringing has remained unchanged and is described as 'pretty loud,' particularly in quiet environments. It does not affect his sleep, as he uses a fan to drown out the noise.  He was initially prescribed Augmentin, which caused vomiting for about two and a half hours. Subsequently, he was switched to cefdinir, which he tolerated well. He completed a ten-day course of cefdinir, which ended on the previous Monday.  He reports ongoing nasal congestion but denies any chronic cough. He has been using Flonase  as part of his treatment regimen. No impact on sleep from the ringing in his ear.     Review of Systems  Constitutional:  Negative for chills, fatigue and fever.  HENT:  Positive for tinnitus. Negative for congestion, ear discharge and  ear pain.   Respiratory:  Negative for chest tightness, shortness of breath and wheezing.   Cardiovascular:  Negative for chest pain and palpitations.  Gastrointestinal:  Negative for abdominal pain, blood in stool and rectal pain.  Genitourinary:  Negative for dysuria and frequency.  Musculoskeletal:  Negative for back pain, myalgias and neck stiffness.  Skin:  Negative for rash.  Neurological:  Negative for dizziness, weakness and numbness.  Hematological:  Negative for adenopathy.  Psychiatric/Behavioral:  Negative for behavioral problems and decreased concentration.       Past Medical History:  Diagnosis Date   Allergy    seasonal   Asthma    due to allergy in [place], treated x1   Hyperlipidemia    Psoriasis    Seizure disorder (HCC)    last seizure at age 57.     Social History   Socioeconomic History   Marital status: Single    Spouse name: Not on file   Number of children: Not on file   Years of education: Not on file   Highest education level: Associate degree: occupational, Scientist, product/process development, or vocational program  Occupational History   Not on file  Tobacco Use   Smoking status: Never   Smokeless tobacco: Never  Vaping Use   Vaping status: Never Used  Substance and Sexual Activity   Alcohol use: Yes    Alcohol/week: 1.0 standard drink of alcohol    Types: 1 Cans of beer per week    Comment: occasional   Drug use: No   Sexual activity: Yes    Birth control/protection: Condom  Other Topics  Concern   Not on file  Social History Narrative   Right handed      Highest level of edu - ADN      Lives in one story home   Social Drivers of Health   Financial Resource Strain: Low Risk  (11/22/2023)   Overall Financial Resource Strain (CARDIA)    Difficulty of Paying Living Expenses: Not very hard  Food Insecurity: No Food Insecurity (11/22/2023)   Hunger Vital Sign    Worried About Running Out of Food in the Last Year: Never true    Ran Out of Food in the Last  Year: Never true  Transportation Needs: No Transportation Needs (11/22/2023)   PRAPARE - Administrator, Civil Service (Medical): No    Lack of Transportation (Non-Medical): No  Physical Activity: Sufficiently Active (11/22/2023)   Exercise Vital Sign    Days of Exercise per Week: 4 days    Minutes of Exercise per Session: 120 min  Stress: No Stress Concern Present (11/22/2023)   [employer] of Occupational Health - Occupational Stress Questionnaire    Feeling of Stress : Not at all  Social Connections: Moderately Isolated (11/22/2023)   Social Connection and Isolation Panel    Frequency of Communication with Friends and Family: Twice a week    Frequency of Social Gatherings with Friends and Family: Once a week    Attends Religious Services: 1 to 4 times per year    Active Member of [employer] or Organizations: No    Attends Engineer, structural: Not on file    Marital Status: Divorced  Intimate Partner Violence: Not on file    Past Surgical History:  Procedure Laterality Date   COLONOSCOPY  2011   MAC   POLYPECTOMY      Family History  Problem Relation Age of Onset   Alcohol abuse Father    Cancer Maternal Grandfather    Colon cancer Neg Hx    Colon polyps Neg Hx    Esophageal cancer Neg Hx    Rectal cancer Neg Hx    Stomach cancer Neg Hx     Allergies  Allergen Reactions   Augmentin [Amoxicillin-Pot Clavulanate] Nausea And Vomiting   Meloxicam  Rash   Tricor [Fenofibrate] Rash    Current Outpatient Medications on File Prior to Visit  Medication Sig Dispense Refill   acetaminophen (TYLENOL) 325 MG tablet Take 650 mg by mouth every 6 (six) hours as needed.     albuterol (VENTOLIN HFA) 108 (90 Base) MCG/ACT inhaler Inhale 2 puffs into the lungs every 6 (six) hours as needed for wheezing or shortness of breath.     benzonatate (TESSALON) 100 MG capsule Take 1 capsule (100 mg total) by mouth 3 (three) times daily as needed for cough. 20 capsule 0    cefdinir (OMNICEF) 300 MG capsule Take 1 capsule (300 mg total) by mouth 2 (two) times daily. 20 capsule 0   fluticasone  (FLONASE ) 50 MCG/ACT nasal spray Place 2 sprays into both nostrils daily. 16 g 1   metFORMIN  (GLUCOPHAGE ) 500 MG tablet Take 1 tablet (500 mg total) by mouth 2 (two) times daily with a meal. 180 tablet 3   phenytoin  (DILANTIN ) 100 MG ER capsule Take 5 capsules every night 450 capsule 4   Risankizumab-rzaa,150 MG Dose, (SKYRIZI, 150 MG DOSE,) 75 MG/0.83ML PSKT      rosuvastatin  (CRESTOR ) 20 MG tablet Take 1 tablet (20 mg total) by mouth daily. 90 tablet 3   No current facility-administered  medications on file prior to visit.    BP 124/82   Pulse 66   Temp 98.1 F (36.7 C) (Oral)   Resp 14   Ht 5' 8 (1.727 m)   Wt 199 lb 9.6 oz (90.5 kg)   SpO2 96%   BMI 30.35 kg/m         Objective:   Physical Exam  General Mental Status- Alert. General Appearance- Not in acute distress.    Skin General: Color- Normal Color. Moisture- Normal Moisture.   Neck Carotid Arteries- Normal color. Moisture- Normal Moisture. No carotid bruits. No JVD.   Chest and Lung Exam Auscultation: Breath Sounds:-CTA   Cardiovascular Auscultation:Rythm- RRR Murmurs & Other Heart Sounds:Auscultation of the heart reveals- No Murmurs.   Abdomen Inspection:-Inspeection Normal. Palpation/Percussion:Note:No mass. Palpation and Percussion of the abdomen reveal- Non Tender, Non Distended + BS, no rebound or guarding.     Neurologic Cranial Nerve exam:- CN III-XII intact(No nystagmus), symmetric smile. Strength:- 5/5 equal and symmetric strength both upper and lower extremities.    Heent- no sinus pressure. Both tm still mild red. Rt  still worse than left. No wax on either side.      Assessment & Plan:   Patient Instructions  Bilateral otitis media with right-sided tinnitus and nasal congestion Persistent bilateral otitis media with right-sided tinnitus and nasal congestion for over  a month and a half. Previous treatments included azithromycin and Augmentin. Nasal congestion persists, but coughing has resolved. - Administer 1 gram injection of Rocephin. - Prescribe four more days of cefdinir. - Continue Flonase . - Refer to ENT specialist. - Instruct to call ENT in 7-10 days if no contact is made. - Advise to report worsening pain, tinnitus, or new symptoms.  Augmentin allergy Augmentin caused vomiting lasting two to two and a half hours. Cefdinir was tolerated without adverse effects. - List Augmentin as an allergy.  Follow up date to be determined after ENT referral or sooner if needed.   Jacob Diop, PA-C

## 2024-08-17 NOTE — Addendum Note (Signed)
 Addended by: DORINA DALLAS HERO on: 08/17/2024 04:09 PM   Modules accepted: Orders

## 2024-09-29 ENCOUNTER — Other Ambulatory Visit: Payer: Self-pay | Admitting: Medical

## 2024-09-29 ENCOUNTER — Telehealth: Payer: Self-pay | Admitting: Neurology

## 2024-09-29 DIAGNOSIS — G40B09 Juvenile myoclonic epilepsy, not intractable, without status epilepticus: Secondary | ICD-10-CM

## 2024-09-29 MED ORDER — PHENYTOIN SODIUM EXTENDED 100 MG PO CAPS: ORAL_CAPSULE | ORAL | 1 refills | Status: AC

## 2024-09-29 NOTE — Telephone Encounter (Signed)
Refills sent in for pt.  

## 2024-09-29 NOTE — Telephone Encounter (Signed)
 Pt called in this afternoon and he would like to get a refill on his prescription called: phenytoin  (DILANTIN ) 100 MG ER capsule until his next scheduled appointment  With our office on 03-03-25 at 11: 10a. m . Thanks

## 2024-10-03 ENCOUNTER — Ambulatory Visit: Payer: Commercial Managed Care - PPO | Admitting: Neurology

## 2024-11-28 ENCOUNTER — Other Ambulatory Visit: Payer: Self-pay | Admitting: Medical

## 2024-11-28 NOTE — Telephone Encounter (Signed)
 Please get pt to schedule follow up in June.

## 2025-03-03 ENCOUNTER — Ambulatory Visit: Admitting: Neurology
